# Patient Record
Sex: Male | Born: 1955 | Race: White | Hispanic: No | Smoking: Never smoker
Health system: Southern US, Community
[De-identification: ages and names within clinical notes are randomized; demographics above are authoritative.]

## PROBLEM LIST (undated history)

## (undated) DIAGNOSIS — R251 Tremor, unspecified: Secondary | ICD-10-CM

## (undated) DIAGNOSIS — G20 Parkinson's disease: Secondary | ICD-10-CM

## (undated) DIAGNOSIS — G20A1 Parkinson's disease without dyskinesia, without mention of fluctuations: Secondary | ICD-10-CM

## (undated) HISTORY — DX: Tremor, unspecified: R25.1

---

## 2016-11-26 ENCOUNTER — Ambulatory Visit: Payer: Self-pay | Admitting: Physician Assistant

## 2016-12-10 ENCOUNTER — Ambulatory Visit: Payer: Self-pay | Admitting: Physician Assistant

## 2016-12-17 ENCOUNTER — Ambulatory Visit: Payer: Self-pay | Admitting: Physician Assistant

## 2017-04-24 ENCOUNTER — Encounter: Payer: Self-pay | Admitting: Neurology

## 2017-04-30 ENCOUNTER — Encounter: Payer: Self-pay | Admitting: Neurology

## 2017-04-30 NOTE — Progress Notes (Addendum)
Thomas Huber was seen today in the movement disorders clinic for neurologic consultation at the request of Vyas, Dhruv B, MD.   The evaluation is for tremor.    This patient is accompanied in the office by his cousin who supplements the history.  Tremor started approximately 4 years ago and started initially in the L hand but over the last 6 months it involves the R hand.  Tremor is most noticeable when he is sitting at rest.   There is no family hx of tremor.    Affected by caffeine:  Doesn't know (5-6 diet pepsi per day on a "slow day") Affected by alcohol:  Yes.   (12 pack beer every 2 weeks - 1 can every 2 days) Affected by stress:  Yes.   Affected by fatigue:  No. Spills soup if on spoon:  No. Spills glass of liquid if full:  No.  Current/Previously tried tremor medications: propranolol 20 mg q day - started on this 3 months ago by PCP and it didn't help.  Taking one tablet at bedtime  Specific Symptoms:   Voice: softer Sleep: trouble staying asleep  Vivid Dreams:  No.  Acting out dreams:  No. Wet Pillows: Yes.   Postural symptoms:  Yes.    Falls?  No. Bradykinesia symptoms: shuffling gait, slow movements and difficulty getting out of a chair Loss of smell:  No. Loss of taste:  No. Urinary Incontinence:  No. Difficulty Swallowing:  No. Handwriting, micrographia: Yes.   Trouble with ADL's:  No. (struggles with tying shoes some) Depression:  No. Memory changes:  No. Hallucinations:  No.  visual distortions: Yes.  , rarely N/V:  No. Lightheaded:  No.  Syncope: No. Diplopia:  No. Dyskinesia:  No.  Exposure to GI meds/antidepressants:  no  Neuroimaging of the brain has not previously been performed.    PREVIOUS MEDICATIONS: none to date  ALLERGIES:  No Known Allergies  CURRENT MEDICATIONS:  Outpatient Encounter Medications as of 05/05/2017  Medication Sig  . propranolol (INDERAL) 40 MG tablet Take 40 mg by mouth at bedtime.  . carbidopa-levodopa (SINEMET IR)  25-100 MG tablet Take 1 tablet by mouth 3 (three) times daily.  . [DISCONTINUED] propranolol (INDERAL) 20 MG tablet Take 20 mg by mouth daily.   No facility-administered encounter medications on file as of 05/05/2017.     PAST MEDICAL HISTORY:   Past Medical History:  Diagnosis Date  . Tremor     PAST SURGICAL HISTORY:  History reviewed. No pertinent surgical history.  SOCIAL HISTORY:   Social History   Socioeconomic History  . Marital status: Unknown    Spouse name: Not on file  . Number of children: Not on file  . Years of education: Not on file  . Highest education level: Not on file  Social Needs  . Financial resource strain: Not on file  . Food insecurity - worry: Not on file  . Food insecurity - inability: Not on file  . Transportation needs - medical: Not on file  . Transportation needs - non-medical: Not on file  Occupational History  . Occupation: unemployed  Tobacco Use  . Smoking status: Never Smoker  . Smokeless tobacco: Never Used  Substance and Sexual Activity  . Alcohol use: Yes    Comment: 1 beer every other day  . Drug use: No  . Sexual activity: Not on file  Other Topics Concern  . Not on file  Social History Narrative  . Not on file  FAMILY HISTORY:   Family Status  Relation Name Status  . Mother  Alive  . Father  Deceased  . Son  Alive  . Daughter  Alive    ROS:  A complete 10 system review of systems was obtained and was unremarkable apart from what is mentioned above.  PHYSICAL EXAMINATION:    VITALS:   Vitals:   05/05/17 1338  BP: 134/90  Pulse: 62  SpO2: 94%  Weight: 219 lb (99.3 kg)  Height: 5' 7"  (1.702 m)    GEN:  The patient appears stated age and is in NAD. HEENT:  Normocephalic, atraumatic.  The mucous membranes are moist. The superficial temporal arteries are without ropiness or tenderness. CV:  RRR Lungs:  CTAB Neck/HEME:  There are no carotid bruits bilaterally.  Neurological examination:  Orientation: The  patient is alert and oriented x3. Fund of knowledge is appropriate.  Recent and remote memory are intact.  Attention and concentration are normal.    Able to name objects and repeat phrases. Cranial nerves: There is good facial symmetry. There is facial hypomimia.  Pupils are equal round and reactive to light bilaterally. Fundoscopic exam reveals clear margins bilaterally. Extraocular muscles are intact. The visual fields are full to confrontational testing. The speech is fluent and clear but he is hypophonic. Soft palate rises symmetrically and there is no tongue deviation. Hearing is intact to conversational tone. Sensation: Sensation is intact to light and pinprick throughout (facial, trunk, extremities). Vibration is intact at the bilateral big toe. There is no extinction with double simultaneous stimulation. There is no sensory dermatomal level identified. Motor: Strength is 5/5 in the bilateral upper and lower extremities.   Shoulder shrug is equal and symmetric.  There is no pronator drift. Deep tendon reflexes: Deep tendon reflexes are 1/4 at the bilateral biceps, triceps, brachioradialis, patella and achilles. Plantar responses are downgoing bilaterally.  Movement examination: Tone: There is moderate increased tone in the bilateral upper extremities.  The tone in the lower extremities is moderately increased.  Abnormal movements: There is independent R and LUE resting tremor Coordination:  There is decremation with RAM's, with any form of RAMS, including alternating supination and pronation of the forearm, hand opening and closing, finger taps, heel taps and toe taps bilaterally Gait and Station: The patient has minimal difficulty arising out of a deep-seated chair without the use of the hands. The patient's stride length is decreased.  The patient has a negative pull test.      Addendum labs:   Lab work is received from his primary care physician which was dated January 21, 2017.  Sodium was  143, potassium 4.1, chloride 102, CO2 25, BUN 6, creatinine 1.05, glucose 104, AST 24, ALT 24, alkaline phosphatase 68.  White blood cells were 7.5, hemoglobin 16.1, hematocrit 47.3 and platelets 218.   ASSESSMENT/PLAN:  1.  Idiopathic Parkinson's disease.  The patient has tremor, bradykinesia, rigidity and mild postural instability.  Dx: 05/05/17 but suspect that has had disease for 4 years (onset of unilateral tremor)  -We discussed the diagnosis as well as pathophysiology of the disease.  We discussed treatment options as well as prognostic indicators.  Patient education was provided.  -We discussed that it used to be thought that levodopa would increase risk of melanoma but now it is believed that Parkinsons itself likely increases risk of melanoma. he is to get regular skin checks.  -Greater than 50% of the 60 minute visit was spent in counseling answering questions and  talking about what to expect now as well as in the future.  We talked about medication options as well as potential future surgical options.  We talked about safety in the home.  -We decided to add carbidopa/levodopa 25/100.  1/2 tab tid x 1 wk, then 1/2 in am & noon & 1 at night for a week, then 1/2 in am &1 at noon &night for a week, then 1 po tid.  Risks, benefits, side effects and alternative therapies were discussed.  The opportunity to ask questions was given and they were answered to the best of my ability.  The patient expressed understanding and willingness to follow the outlined treatment protocols.  -Home PT.  Pt doesn't drive and relies on elderly cousin to drive.  Does use some public transportation  -We discussed community resources in the area.  Printed off open doors application at [facility] to try and get him exercise at reduced cost  -will try to get copy of patient labs  -d/c propranolol  -met with PD social worker today  2.  Follow up is anticipated in the next few months, sooner should new neurologic issues  arise.  Cc:  Arsenio Katz, NP

## 2017-05-05 ENCOUNTER — Ambulatory Visit: Payer: Medicaid Other | Admitting: Neurology

## 2017-05-05 ENCOUNTER — Encounter: Payer: Self-pay | Admitting: Psychology

## 2017-05-05 ENCOUNTER — Encounter: Payer: Self-pay | Admitting: Neurology

## 2017-05-05 VITALS — BP 134/90 | HR 62 | Ht 67.0 in | Wt 219.0 lb

## 2017-05-05 DIAGNOSIS — G20 Parkinson's disease: Secondary | ICD-10-CM

## 2017-05-05 MED ORDER — CARBIDOPA-LEVODOPA 25-100 MG PO TABS: 1.0000 | ORAL_TABLET | Freq: Three times a day (TID) | ORAL | 1 refills | Status: DC

## 2017-05-05 NOTE — Patient Instructions (Signed)
1. Start Carbidopa Levodopa as follows:  Take 1/2 tablet three times daily, at least 30 minutes before meals, for one week  Then take 1/2 tablet in the morning, 1/2 tablet in the afternoon, 1 tablet in the evening, at least 30 minutes before meals, for one week  Then take 1/2 tablet in the morning, 1 tablet in the afternoon, 1 tablet in the evening, at least 30 minutes before meals, for one week  Then take 1 tablet three times daily, at least 30 minutes before meals  2. We will order home health physical therapy. They will contact you directly to schedule a time to come out.

## 2017-05-05 NOTE — Addendum Note (Signed)
Addended by: Kerin SalenAT, Koah Chisenhall S on: 05/05/2017 01:42 PM   Modules accepted: Level of Service

## 2017-05-05 NOTE — Progress Notes (Signed)
I met with the patient and his family member while they were in the clinic today.  We reviewed some newly diagnosed information/resources.  However, I focused mainly on resources that the patient would be able to functionally use at this time due to barriers to transportation.  The closest support group is in [place].  There are no exercise classes specifically for Parkinson's in [place], so the next best option would be for the patient to go to the [facility]  through their open doors financial assistance program.  The benefit of attending the [facility] will also allow him to be more engaged in the community.  I provided him with application for this and encouraged him and his family member to complete it and stop by the [facility] this week.  The patient has access to [employer] transportation from his apartment complex and will have the capacity to get to the [facility] easily.    The plan is for the following: The patient will get his prescriptions filled and take as prescribed. The patient will start his in him physical therapy. The patient will complete the [facility] application and start attending the [employer] program prior to his next appointment with Dr. Carles Collet. The patient will call me with any problems or concerns that he has in terms of needing resources or coping with this new diagnosis.

## 2017-05-09 ENCOUNTER — Telehealth: Payer: Self-pay | Admitting: Neurology

## 2017-05-09 NOTE — Telephone Encounter (Signed)
Spoke with Thomas Huber and gave verbal order for PT.

## 2017-05-09 NOTE — Telephone Encounter (Signed)
Thomas Huber called needing Verbal orders. Patient had completed PT today. Please call. Thanks

## 2017-08-11 NOTE — Progress Notes (Deleted)
Thomas Huber was seen today in the movement disorders clinic for neurologic consultation at the request of Arsenio Katz, NP.   The evaluation is for tremor.    This patient is accompanied in the office by his cousin who supplements the history.  Tremor started approximately 4 years ago and started initially in the L hand but over the last 6 months it involves the R hand.  Tremor is most noticeable when he is sitting at rest.   There is no family hx of tremor.    08/12/17 update: Patient is seen today in follow-up.  Patient was diagnosed with Parkinson's disease last visit and started on carbidopa/levodopa 25/100 and worked to 1 tablet 3 times per day.  He did physical therapy in the home.  Pt denies falls.  Pt denies lightheadedness, near syncope.  No hallucinations.  Mood has been good.  PREVIOUS MEDICATIONS: none to date  ALLERGIES:  No Known Allergies  CURRENT MEDICATIONS:  Outpatient Encounter Medications as of 08/12/2017  Medication Sig  . carbidopa-levodopa (SINEMET IR) 25-100 MG tablet Take 1 tablet by mouth 3 (three) times daily.  . propranolol (INDERAL) 40 MG tablet Take 40 mg by mouth at bedtime.   No facility-administered encounter medications on file as of 08/12/2017.     PAST MEDICAL HISTORY:   Past Medical History:  Diagnosis Date  . Tremor     PAST SURGICAL HISTORY:  No past surgical history on file.  SOCIAL HISTORY:   Social History   Socioeconomic History  . Marital status: Unknown    Spouse name: Not on file  . Number of children: Not on file  . Years of education: Not on file  . Highest education level: Not on file  Occupational History  . Occupation: unemployed  Social Needs  . Financial resource strain: Not on file  . Food insecurity:    Worry: Not on file    Inability: Not on file  . Transportation needs:    Medical: Not on file    Non-medical: Not on file  Tobacco Use  . Smoking status: Never Smoker  . Smokeless tobacco: Never Used  Substance  and Sexual Activity  . Alcohol use: Yes    Comment: 1 beer every other day  . Drug use: No  . Sexual activity: Not on file  Lifestyle  . Physical activity:    Days per week: Not on file    Minutes per session: Not on file  . Stress: Not on file  Relationships  . Social connections:    Talks on phone: Not on file    Gets together: Not on file    Attends religious service: Not on file    Active member of club or organization: Not on file    Attends meetings of clubs or organizations: Not on file    Relationship status: Not on file  . Intimate partner violence:    Fear of current or ex partner: Not on file    Emotionally abused: Not on file    Physically abused: Not on file    Forced sexual activity: Not on file  Other Topics Concern  . Not on file  Social History Narrative  . Not on file    FAMILY HISTORY:   Family Status  Relation Name Status  . Mother  Alive  . Father  Deceased  . Son  Alive  . Daughter  Alive    ROS:  A complete 10 system review of systems was obtained  and was unremarkable apart from what is mentioned above.  PHYSICAL EXAMINATION:    VITALS:   There were no vitals filed for this visit.  GEN:  The patient appears stated age and is in NAD. HEENT:  Normocephalic, atraumatic.  The mucous membranes are moist. The superficial temporal arteries are without ropiness or tenderness. CV:  RRR Lungs:  CTAB Neck/HEME:  There are no carotid bruits bilaterally.  Neurological examination:  Orientation: The patient is alert and oriented x3. Fund of knowledge is appropriate.  Recent and remote memory are intact.  Attention and concentration are normal.    Able to name objects and repeat phrases. Cranial nerves: There is good facial symmetry. There is facial hypomimia.  Pupils are equal round and reactive to light bilaterally. Fundoscopic exam reveals clear margins bilaterally. Extraocular muscles are intact. The visual fields are full to confrontational testing.  The speech is fluent and clear but he is hypophonic. Soft palate rises symmetrically and there is no tongue deviation. Hearing is intact to conversational tone. Sensation: Sensation is intact to light and pinprick throughout (facial, trunk, extremities). Vibration is intact at the bilateral big toe. There is no extinction with double simultaneous stimulation. There is no sensory dermatomal level identified. Motor: Strength is 5/5 in the bilateral upper and lower extremities.   Shoulder shrug is equal and symmetric.  There is no pronator drift. Deep tendon reflexes: Deep tendon reflexes are 1/4 at the bilateral biceps, triceps, brachioradialis, patella and achilles. Plantar responses are downgoing bilaterally.  Movement examination: Tone: There is moderate increased tone in the bilateral upper extremities.  The tone in the lower extremities is moderately increased.  Abnormal movements: There is independent R and LUE resting tremor Coordination:  There is decremation with RAM's, with any form of RAMS, including alternating supination and pronation of the forearm, hand opening and closing, finger taps, heel taps and toe taps bilaterally Gait and Station: The patient has minimal difficulty arising out of a deep-seated chair without the use of the hands. The patient's stride length is decreased.  The patient has a negative pull test.      labs:   Lab work is received from his primary care physician which was dated January 21, 2017.  Sodium was 143, potassium 4.1, chloride 102, CO2 25, BUN 6, creatinine 1.05, glucose 104, AST 24, ALT 24, alkaline phosphatase 68.  White blood cells were 7.5, hemoglobin 16.1, hematocrit 47.3 and platelets 218.   ASSESSMENT/PLAN:  1.  Idiopathic Parkinson's disease.  The patient has tremor, bradykinesia, rigidity and mild postural instability.  Dx: 05/05/17 but suspect that has had disease for 4 years (onset of unilateral tremor)  -We discussed the diagnosis as well as  pathophysiology of the disease.  We discussed treatment options as well as prognostic indicators.  Patient education was provided.  -We discussed that it used to be thought that levodopa would increase risk of melanoma but now it is believed that Parkinsons itself likely increases risk of melanoma. he is to get regular skin checks.  -Home PT.  Pt doesn't drive and relies on elderly cousin to drive.  Does use some public transportation  -We discussed community resources in the area.  Printed off open doors application at [facility] to try and get him exercise at reduced cost  -will try to get copy of patient labs  -d/c propranolol  -met with PD social worker today  2.  Follow up is anticipated in the next few months, sooner should new neurologic issues  arise.  Cc:  Arsenio Katz, NP

## 2017-08-12 ENCOUNTER — Ambulatory Visit: Payer: Medicaid Other | Admitting: Neurology

## 2017-09-05 NOTE — Progress Notes (Signed)
Thomas Huber was seen today in the movement disorders clinic for neurologic consultation at the request of Salley Slaughter, NP.   The evaluation is for tremor.    This patient is accompanied in the office by his cousin who supplements the history.  Tremor started approximately 4 years ago and started initially in the L hand but over the last 6 months it involves the R hand.  Tremor is most noticeable when he is sitting at rest.   There is no family hx of tremor.    09/08/17 update: Patient is seen today in follow-up.  Patient was diagnosed with Parkinson's disease last visit and started on carbidopa/levodopa 25/100 and worked to 1 tablet 3 times per day (7am/1pm/7pm). This patient is accompanied in the office by his cousin who supplements the history.He reports that it helped.   He did physical therapy in the home.  Pt denies falls.  He is able to walk around his apartment building several laps.  Pt denies lightheadedness, near syncope.  No hallucinations.  Mood has been good.  PREVIOUS MEDICATIONS: none to date  ALLERGIES:  No Known Allergies  CURRENT MEDICATIONS:  Outpatient Encounter Medications as of 09/08/2017  Medication Sig  . carbidopa-levodopa (SINEMET IR) 25-100 MG tablet Take 1 tablet by mouth 3 (three) times daily.  . pramipexole (MIRAPEX) 0.125 MG tablet 1 tablet three times per day for a week, then 2 tablets three times per day for a week and then fill the 0.5 mg tablet take as directed  . pramipexole (MIRAPEX) 0.5 MG tablet Take 1 tablet (0.5 mg total) by mouth 3 (three) times daily.  . [DISCONTINUED] propranolol (INDERAL) 40 MG tablet Take 40 mg by mouth at bedtime.   No facility-administered encounter medications on file as of 09/08/2017.     PAST MEDICAL HISTORY:   Past Medical History:  Diagnosis Date  . Tremor     PAST SURGICAL HISTORY:  History reviewed. No pertinent surgical history.  SOCIAL HISTORY:   Social History   Socioeconomic History  . Marital status:  Unknown    Spouse name: Not on file  . Number of children: Not on file  . Years of education: Not on file  . Highest education level: Not on file  Occupational History  . Occupation: unemployed  Social Needs  . Financial resource strain: Not on file  . Food insecurity:    Worry: Not on file    Inability: Not on file  . Transportation needs:    Medical: Not on file    Non-medical: Not on file  Tobacco Use  . Smoking status: Never Smoker  . Smokeless tobacco: Never Used  Substance and Sexual Activity  . Alcohol use: Yes    Comment: 1 beer every other day  . Drug use: No  . Sexual activity: Not on file  Lifestyle  . Physical activity:    Days per week: Not on file    Minutes per session: Not on file  . Stress: Not on file  Relationships  . Social connections:    Talks on phone: Not on file    Gets together: Not on file    Attends religious service: Not on file    Active member of club or organization: Not on file    Attends meetings of clubs or organizations: Not on file    Relationship status: Not on file  . Intimate partner violence:    Fear of current or ex partner: Not on file  Emotionally abused: Not on file    Physically abused: Not on file    Forced sexual activity: Not on file  Other Topics Concern  . Not on file  Social History Narrative  . Not on file    FAMILY HISTORY:   Family Status  Relation Name Status  . Mother  Alive  . Father  Deceased  . Son  Alive  . Daughter  Alive    ROS:  A complete 10 system review of systems was obtained and was unremarkable apart from what is mentioned above.  PHYSICAL EXAMINATION:    VITALS:   Vitals:   09/08/17 1359  BP: 112/68  Pulse: 77  SpO2: 93%  Weight: 216 lb (98 kg)  Height: 5\' 7"  (1.702 m)    GEN:  The patient appears stated age and is in NAD. HEENT:  Normocephalic, atraumatic.  The mucous membranes are moist. The superficial temporal arteries are without ropiness or tenderness. CV:   RRR Lungs:  CTAB Neck/HEME:  There are no carotid bruits bilaterally.  Neurological examination:  Orientation: The patient is alert and oriented x3. Cranial nerves: There is good facial symmetry. The speech is fluent and clear. Soft palate rises symmetrically and there is no tongue deviation. Hearing is intact to conversational tone. Sensation: Sensation is intact to light touch throughout Motor: Strength is 5/5 in the bilateral upper and lower extremities.   Shoulder shrug is equal and symmetric.  There is no pronator drift.  Movement examination: Tone: There is moderate increased tone in the bilateral upper extremities.  The tone in the lower extremities is moderately increased.  Abnormal movements: There is independent R and LUE resting tremor Coordination:  There is decremation with RAM's, with any form of RAMS, including alternating supination and pronation of the forearm, hand opening and closing, finger taps, heel taps and toe taps bilaterally, R more than left Gait and Station: The patient has minimal difficulty arising out of a deep-seated chair without the use of the hands. The patient's stride length is decreased.  The patient has a negative pull test.      labs:   Lab work is received from his primary care physician which was dated January 21, 2017.  Sodium was 143, potassium 4.1, chloride 102, CO2 25, BUN 6, creatinine 1.05, glucose 104, AST 24, ALT 24, alkaline phosphatase 68.  White blood cells were 7.5, hemoglobin 16.1, hematocrit 47.3 and platelets 218.   ASSESSMENT/PLAN:  1.  Idiopathic Parkinson's disease.  The patient has tremor, bradykinesia, rigidity and mild postural instability.  Dx: 05/05/17 but suspect that has had disease for 4 years (onset of unilateral tremor)  -We discussed the diagnosis as well as pathophysiology of the disease.  We discussed treatment options as well as prognostic indicators.  Patient education was provided.  -We discussed that it used to be  thought that levodopa would increase risk of melanoma but now it is believed that Parkinsons itself likely increases risk of melanoma. he is to get regular skin checks.  -Home PT.  Pt doesn't drive and relies on elderly cousin to drive.  Does use some public transportation  -Discussed importance of increasing exercise.  -Patient is on carbidopa/levodopa 25/100, 1 tablet 3 times per day.  States that he takes it faithfully.  States that he took it about 2 hours prior to arriving today and examination was very similar to last visit.  May need to increase the dose, but decided to go ahead and try to add pramipexole  first.  -add pramipexole and work 0.5 mg tid.  R/b/se discussed extensively, including but not limited to compulsive behaviors and sleep attacks.    -Invited to Parkinson's symposium.  2.  Follow-up in the next 4 months, sooner should new neurologic issues arise.  Much greater than 50% of this visit was spent in counseling and coordinating care.  Total face to face time:  25 min  Cc:  Salley SlaughterBoone, Angela, NP

## 2017-09-08 ENCOUNTER — Ambulatory Visit: Payer: Medicaid Other | Admitting: Neurology

## 2017-09-08 ENCOUNTER — Encounter: Payer: Self-pay | Admitting: Neurology

## 2017-09-08 VITALS — BP 112/68 | HR 77 | Ht 67.0 in | Wt 216.0 lb

## 2017-09-08 DIAGNOSIS — G20 Parkinson's disease: Secondary | ICD-10-CM

## 2017-09-08 MED ORDER — PRAMIPEXOLE DIHYDROCHLORIDE 0.5 MG PO TABS: 0.5000 mg | ORAL_TABLET | Freq: Three times a day (TID) | ORAL | 1 refills | Status: DC

## 2017-09-08 MED ORDER — PRAMIPEXOLE DIHYDROCHLORIDE 0.125 MG PO TABS: ORAL_TABLET | ORAL | 0 refills | Status: DC

## 2017-09-08 NOTE — Patient Instructions (Signed)
1. Start mirapex (pramipexole) as follows:  0.125 mg - 1 tablet three times per day for a week, then 2 tablets three times per day for a week and then fill the 0.5 mg tablet and take that, 1 pill three times per day  2. Registration is OPEN!    Third Annual Parkinson's Education Symposium   To register: [email or web address]      Search:  Parkinson's Symposium  Register EACH person attending individually Questions: Contact Jessica Thomas, LCSW  [phone] or [email or web address]    

## 2017-11-06 ENCOUNTER — Other Ambulatory Visit: Payer: Self-pay | Admitting: Neurology

## 2017-11-06 MED ORDER — CARBIDOPA-LEVODOPA 25-100 MG PO TABS: 1.0000 | ORAL_TABLET | Freq: Three times a day (TID) | ORAL | 1 refills | Status: DC

## 2018-01-27 ENCOUNTER — Ambulatory Visit: Payer: Medicaid Other | Admitting: Neurology

## 2018-02-02 NOTE — Progress Notes (Deleted)
Thomas Huber was seen today in the movement disorders clinic for neurologic consultation at the request of Salley Slaughter, NP.   The evaluation is for tremor.    This patient is accompanied in the office by his cousin who supplements the history.  Tremor started approximately 4 years ago and started initially in the L hand but over the last 6 months it involves the R hand.  Tremor is most noticeable when he is sitting at rest.   There is no family hx of tremor.    09/08/17 update: Patient is seen today in follow-up.  Patient was diagnosed with Parkinson's disease last visit and started on carbidopa/levodopa 25/100 and worked to 1 tablet 3 times per day (7am/1pm/7pm). This patient is accompanied in the office by his cousin who supplements the history.He reports that it helped.   He did physical therapy in the home.  Pt denies falls.  He is able to walk around his apartment building several laps.  Pt denies lightheadedness, near syncope.  No hallucinations.  Mood has been good.  02/03/18 update: Patient is seen today in follow-up for Parkinson's disease.  Patient is on carbidopa/levodopa 25/100, 1 tablet 3 times per day.  Last visit, we added pramipexole and worked to 0.5 mg 3 times per day.  There have been no compulsive behaviors.  No sleep attacks.  No hallucinations.  No falls.  No lightheadedness or near syncope.  PREVIOUS MEDICATIONS: none to date  ALLERGIES:  No Known Allergies  CURRENT MEDICATIONS:  Outpatient Encounter Medications as of 02/03/2018  Medication Sig  . carbidopa-levodopa (SINEMET IR) 25-100 MG tablet Take 1 tablet by mouth 3 (three) times daily.  . pramipexole (MIRAPEX) 0.125 MG tablet 1 tablet three times per day for a week, then 2 tablets three times per day for a week and then fill the 0.5 mg tablet take as directed  . pramipexole (MIRAPEX) 0.5 MG tablet Take 1 tablet (0.5 mg total) by mouth 3 (three) times daily.   No facility-administered encounter medications on file  as of 02/03/2018.     PAST MEDICAL HISTORY:   Past Medical History:  Diagnosis Date  . Tremor     PAST SURGICAL HISTORY:  No past surgical history on file.  SOCIAL HISTORY:   Social History   Socioeconomic History  . Marital status: Unknown    Spouse name: Not on file  . Number of children: Not on file  . Years of education: Not on file  . Highest education level: Not on file  Occupational History  . Occupation: unemployed  Social Needs  . Financial resource strain: Not on file  . Food insecurity:    Worry: Not on file    Inability: Not on file  . Transportation needs:    Medical: Not on file    Non-medical: Not on file  Tobacco Use  . Smoking status: Never Smoker  . Smokeless tobacco: Never Used  Substance and Sexual Activity  . Alcohol use: Yes    Comment: 1 beer every other day  . Drug use: No  . Sexual activity: Not on file  Lifestyle  . Physical activity:    Days per week: Not on file    Minutes per session: Not on file  . Stress: Not on file  Relationships  . Social connections:    Talks on phone: Not on file    Gets together: Not on file    Attends religious service: Not on file    Active  member of club or organization: Not on file    Attends meetings of clubs or organizations: Not on file    Relationship status: Not on file  . Intimate partner violence:    Fear of current or ex partner: Not on file    Emotionally abused: Not on file    Physically abused: Not on file    Forced sexual activity: Not on file  Other Topics Concern  . Not on file  Social History Narrative  . Not on file    FAMILY HISTORY:   Family Status  Relation Name Status  . Mother  Alive  . Father  Deceased  . Son  Alive  . Daughter  Alive    ROS:  A complete 10 system review of systems was obtained and was unremarkable apart from what is mentioned above.  PHYSICAL EXAMINATION:    VITALS:   There were no vitals filed for this visit.  GEN:  The patient appears stated  age and is in NAD. HEENT:  Normocephalic, atraumatic.  The mucous membranes are moist. The superficial temporal arteries are without ropiness or tenderness. CV:  RRR Lungs:  CTAB Neck/HEME:  There are no carotid bruits bilaterally.  Neurological examination:  Orientation: The patient is alert and oriented x3. Cranial nerves: There is good facial symmetry. The speech is fluent and clear. Soft palate rises symmetrically and there is no tongue deviation. Hearing is intact to conversational tone. Sensation: Sensation is intact to light touch throughout Motor: Strength is 5/5 in the bilateral upper and lower extremities.   Shoulder shrug is equal and symmetric.  There is no pronator drift.  Movement examination: Tone: There is moderate increased tone in the bilateral upper extremities.  The tone in the lower extremities is moderately increased.  Abnormal movements: There is independent R and LUE resting tremor Coordination:  There is decremation with RAM's, with any form of RAMS, including alternating supination and pronation of the forearm, hand opening and closing, finger taps, heel taps and toe taps bilaterally, R more than left Gait and Station: The patient has minimal difficulty arising out of a deep-seated chair without the use of the hands. The patient's stride length is decreased.  The patient has a negative pull test.      labs:   Lab work is received from his primary care physician which was dated January 21, 2017.  Sodium was 143, potassium 4.1, chloride 102, CO2 25, BUN 6, creatinine 1.05, glucose 104, AST 24, ALT 24, alkaline phosphatase 68.  White blood cells were 7.5, hemoglobin 16.1, hematocrit 47.3 and platelets 218.   ASSESSMENT/PLAN:  1.  Idiopathic Parkinson's disease.  The patient has tremor, bradykinesia, rigidity and mild postural instability.  Dx: 05/05/17 but suspect that has had disease for 4 years (onset of unilateral tremor)  -We discussed the diagnosis as well as  pathophysiology of the disease.  We discussed treatment options as well as prognostic indicators.  Patient education was provided.  -We discussed that it used to be thought that levodopa would increase risk of melanoma but now it is believed that Parkinsons itself likely increases risk of melanoma. he is to get regular skin checks.  -Home PT.  Pt doesn't drive and relies on elderly cousin to drive.  Does use some public transportation  -Discussed importance of increasing exercise.  -Patient is on carbidopa/levodopa 25/100, 1 tablet 3 times per day.  States that he takes it faithfully.  States that he took it about 2 hours prior  to arriving today and examination was very similar to last visit.  May need to increase the dose, but decided to go ahead and try to add pramipexole first.  -***pramipexole   Cc:  Salley Slaughter, NP

## 2018-02-03 ENCOUNTER — Ambulatory Visit: Payer: Medicaid Other | Admitting: Neurology

## 2018-02-10 NOTE — Progress Notes (Signed)
Thomas Huber was seen today in the movement disorders clinic for neurologic consultation at the request of Salley Slaughter, NP.   The evaluation is for tremor.    This patient is accompanied in the office by his cousin who supplements the history.  Tremor started approximately 4 years ago and started initially in the L hand but over the last 6 months it involves the R hand.  Tremor is most noticeable when he is sitting at rest.   There is no family hx of tremor.    09/08/17 update: Patient is seen today in follow-up.  Patient was diagnosed with Parkinson's disease last visit and started on carbidopa/levodopa 25/100 and worked to 1 tablet 3 times per day (7am/1pm/7pm). This patient is accompanied in the office by his cousin who supplements the history.He reports that it helped.   He did physical therapy in the home.  Pt denies falls.  He is able to walk around his apartment building several laps.  Pt denies lightheadedness, near syncope.  No hallucinations.  Mood has been good.  02/16/18 update: Patient is seen today in follow-up for Parkinson's disease.  Pt with his cousin, who supplements the hx.  Patient is on carbidopa/levodopa 25/100, 1 tablet 3 times per day.  Last visit, we added pramipexole and worked to 0.5 mg 3 times per day.  He is on the medication but isn't sure it helps.  Describes start hesitation.  There have been no compulsive behaviors.  No sleep attacks.  No hallucinations in day but a few times at night, he would awaken and think that someone was there but was able to blink it away.  No falls.  No lightheadedness but some dizziness but he attributes that to when he misses medication.  Not doing much CV exercise.    PREVIOUS MEDICATIONS: none to date  ALLERGIES:  No Known Allergies  CURRENT MEDICATIONS:  Outpatient Encounter Medications as of 02/16/2018  Medication Sig  . carbidopa-levodopa (SINEMET IR) 25-100 MG tablet Take 1 tablet by mouth 3 (three) times daily.  . pramipexole  (MIRAPEX) 0.5 MG tablet Take 1 tablet (0.5 mg total) by mouth 3 (three) times daily.  . [DISCONTINUED] pramipexole (MIRAPEX) 0.125 MG tablet 1 tablet three times per day for a week, then 2 tablets three times per day for a week and then fill the 0.5 mg tablet take as directed   No facility-administered encounter medications on file as of 02/16/2018.     PAST MEDICAL HISTORY:   Past Medical History:  Diagnosis Date  . Tremor     PAST SURGICAL HISTORY:  History reviewed. No pertinent surgical history.  SOCIAL HISTORY:   Social History   Socioeconomic History  . Marital status: Unknown    Spouse name: Not on file  . Number of children: 2  . Years of education: Not on file  . Highest education level: Not on file  Occupational History  . Occupation: unemployed  Social Needs  . Financial resource strain: Not on file  . Food insecurity:    Worry: Not on file    Inability: Not on file  . Transportation needs:    Medical: Not on file    Non-medical: Not on file  Tobacco Use  . Smoking status: Never Smoker  . Smokeless tobacco: Never Used  Substance and Sexual Activity  . Alcohol use: Yes    Comment: 1 beer every other day  . Drug use: No  . Sexual activity: Not on file  Lifestyle  .  Physical activity:    Days per week: Not on file    Minutes per session: Not on file  . Stress: Not on file  Relationships  . Social connections:    Talks on phone: Not on file    Gets together: Not on file    Attends religious service: Not on file    Active member of club or organization: Not on file    Attends meetings of clubs or organizations: Not on file    Relationship status: Not on file  . Intimate partner violence:    Fear of current or ex partner: Not on file    Emotionally abused: Not on file    Physically abused: Not on file    Forced sexual activity: Not on file  Other Topics Concern  . Not on file  Social History Narrative  . Not on file    FAMILY HISTORY:   Family  Status  Relation Name Status  . Mother  Alive  . Father  Deceased  . Son  Alive  . Daughter  Alive    ROS:  Review of Systems  Constitutional: Negative.   HENT: Negative.   Eyes: Negative.   Respiratory: Negative.   Cardiovascular: Negative.   Musculoskeletal: Negative.   Skin: Negative.      PHYSICAL EXAMINATION:    VITALS:   Vitals:   02/16/18 0757  BP: 130/74  Pulse: 80  SpO2: 97%  Weight: 220 lb 8 oz (100 kg)  Height: 5\' 7"  (1.702 m)    GEN:  The patient appears stated age and is in NAD. HEENT:  Normocephalic, atraumatic.  The mucous membranes are moist. The superficial temporal arteries are without ropiness or tenderness. CV:  RRR Lungs:  CTAB Neck/HEME:  There are no carotid bruits bilaterally.  Neurological examination:  Orientation: The patient is alert and oriented x3. Cranial nerves: There is good facial symmetry. The speech is fluent and clear. Soft palate rises symmetrically and there is no tongue deviation. Hearing is intact to conversational tone. Sensation: Sensation is intact to light touch throughout Motor: Strength is 5/5 in the bilateral upper and lower extremities.   Shoulder shrug is equal and symmetric.  There is no pronator drift.  Movement examination: Tone: There is moderate increased tone in the bilateral upper extremities.  The tone in the lower extremities is moderately increased.  Abnormal movements: There is independent R and LUE resting tremor Coordination:  There is decremation with RAM's, with any form of RAMS, including alternating supination and pronation of the forearm, hand opening and closing, finger taps, heel taps and toe taps bilaterally, R more than left Gait and Station: The patient has minimal difficulty arising out of a deep-seated chair without the use of the hands. The patient's stride length is decreased.  The patient has a negative pull test.      labs:   Lab work is received from his primary care physician which was  dated January 21, 2017.  We called and this was the most recent lab work that he has had (and we already had this)   ASSESSMENT/PLAN:  1.  Idiopathic Parkinson's disease.  The patient has tremor, bradykinesia, rigidity and mild postural instability.  Dx: 05/05/17 but suspect that has had disease for 4 years (onset of unilateral tremor)  -We discussed the diagnosis as well as pathophysiology of the disease.  We discussed treatment options as well as prognostic indicators.  Patient education was provided.  -We discussed that it used to  be thought that levodopa would increase risk of melanoma but now it is believed that Parkinsons itself likely increases risk of melanoma. he is to get regular skin checks.  -Discussed importance of increasing exercise and discussed several times previously as well.  -Patient is on carbidopa/levodopa 25/100, 1 tablet 3 times per day.  States that he takes it faithfully.  States that he took it about 2 hours prior to arriving today and examination was very similar to last visit.  May need to increase the dose, but decided to go ahead and try to add pramipexole first.  -continue pramipexole, 0.5 mg tid.  Difficult to tell how much medication works as he didn't take it this AM.  I may end up doing a levodopa challenge test in the future.  Having some visual distortions at night and will monitor this  -we will do chem, tsh today.    2.  Gout  -asks me to fill that medication and told him would need to f/u with PCP  3.  Follow up is anticipated in the next few months, sooner should new neurologic issues arise.   Cc:  Salley SlaughterBoone, Angela, NP

## 2018-02-16 ENCOUNTER — Ambulatory Visit: Payer: Medicaid Other | Admitting: Neurology

## 2018-02-16 ENCOUNTER — Other Ambulatory Visit (INDEPENDENT_AMBULATORY_CARE_PROVIDER_SITE_OTHER): Payer: Medicaid Other

## 2018-02-16 ENCOUNTER — Encounter: Payer: Self-pay | Admitting: Neurology

## 2018-02-16 VITALS — BP 130/74 | HR 80 | Ht 67.0 in | Wt 220.5 lb

## 2018-02-16 DIAGNOSIS — G20 Parkinson's disease: Secondary | ICD-10-CM

## 2018-02-16 DIAGNOSIS — Z5181 Encounter for therapeutic drug level monitoring: Secondary | ICD-10-CM

## 2018-02-16 DIAGNOSIS — R251 Tremor, unspecified: Secondary | ICD-10-CM

## 2018-02-16 NOTE — Patient Instructions (Addendum)
Your provider has requested that you have labwork completed today. Please go to [facility] ([id number]) on the second floor of this building before leaving the office today. You do not need to check in. If you are not called within 15 minutes please check with the front desk.    Drink 60 ounces of water per day!   Exercise.

## 2018-02-17 ENCOUNTER — Telehealth: Payer: Self-pay | Admitting: Neurology

## 2018-02-17 LAB — COMPREHENSIVE METABOLIC PANEL
AG Ratio: 1.6 (calc) (ref 1.0–2.5)
ALKALINE PHOSPHATASE (APISO): 71 U/L (ref 40–115)
ALT: 14 U/L (ref 9–46)
AST: 18 U/L (ref 10–35)
Albumin: 4.2 g/dL (ref 3.6–5.1)
BILIRUBIN TOTAL: 0.6 mg/dL (ref 0.2–1.2)
BUN: 11 mg/dL (ref 7–25)
CHLORIDE: 104 mmol/L (ref 98–110)
CO2: 27 mmol/L (ref 20–32)
CREATININE: 0.95 mg/dL (ref 0.70–1.25)
Calcium: 9.3 mg/dL (ref 8.6–10.3)
GLOBULIN: 2.7 g/dL (ref 1.9–3.7)
Glucose, Bld: 82 mg/dL (ref 65–99)
POTASSIUM: 4 mmol/L (ref 3.5–5.3)
SODIUM: 142 mmol/L (ref 135–146)
TOTAL PROTEIN: 6.9 g/dL (ref 6.1–8.1)

## 2018-02-17 LAB — TSH: TSH: 3.2 m[IU]/L (ref 0.40–4.50)

## 2018-02-17 NOTE — Telephone Encounter (Signed)
Patient's aunt made aware of results (on DPR).

## 2018-02-17 NOTE — Telephone Encounter (Signed)
-----   Message from Octaviano Battyebecca S Tat, DO sent at 02/17/2018  7:25 AM EST ----- I have reviewed all lab results which are normal or stable. Please inform the patient.

## 2018-03-19 ENCOUNTER — Other Ambulatory Visit: Payer: Self-pay | Admitting: Neurology

## 2018-05-06 ENCOUNTER — Other Ambulatory Visit: Payer: Self-pay | Admitting: Neurology

## 2018-06-04 ENCOUNTER — Other Ambulatory Visit: Payer: Self-pay | Admitting: Neurology

## 2018-06-26 ENCOUNTER — Telehealth: Payer: Self-pay | Admitting: Neurology

## 2018-06-26 NOTE — Telephone Encounter (Signed)
Thomas Huber sch appt with Tat for a Evisit on 06-29-18 she is not going to be able to do the visit on video or smart phone that day she wants to know if it can just be done over the telephone with patient with out video  Please advise

## 2018-06-26 NOTE — Progress Notes (Signed)
Virtual Visit via Video Note The purpose of this virtual visit is to provide medical care while limiting exposure to the novel coronavirus.    Consent was obtained for video visit:  Yes.   Answered questions that patient had about telehealth interaction:  Yes.   I discussed the limitations, risks, security and privacy concerns of performing an evaluation and management service by video. I also discussed with the patient that there may be a patient responsible charge related to this service. The patient expressed understanding and agreed to proceed.  Pt location: Home Physician Location: office Name of referring provider:  Salley Slaughter, NP I connected with .Thomas Huber at patients initiation/request on 06/29/2018 at  2:00 PM EDT by video and verified that I am speaking with the correct person using two identifiers.  Pt MRN:  [id number] Pt DOB:  [birth year]  Video participants:  Thomas Huber (cousin); Thomas Huber   History of Present Illness:  Patient seen in follow-up for Parkinson's disease.  Patient is on carbidopa/levodopa 25/100, 1 tablet 3 times per day and pramipexole 0.5 mg 3 times per day.  He is taking the medications at 9 AM/5 PM/9 PM.  He does not go to bed until after midnight.  He does think that the medications help, but not as much as they did in the beginning.  No falls since his last visit but he has had freezing.  He notes some tremor.  No lightheadedness or near syncope.  No hallucinations during the day but sometimes if he is laying there at night he may see a "girl and a guy" in the room.  It goes away quickly if he blinks.  It is not scary so more bothersome to him.   Observations/Objective:   GEN:  The patient appears stated age and is in NAD.  Neurological examination:  Orientation: The patient is alert and oriented x3. Cranial nerves: There is good facial symmetry. There is facial hypomimia.  The speech is fluent and clear. Soft palate rises symmetrically and  there is no tongue deviation. Hearing is intact to conversational tone. Motor: Strength is at least antigravity x 4.   Shoulder shrug is equal and symmetric.  There is no pronator drift.  Movement examination: Tone: unable Abnormal movements: None noted Coordination:  There is  decremation with RAM's, with finger taps and hand opening and closing bilaterally.  I was unable to see feet to assess for toe taps and heel taps bilaterally Gait and Station: The patient is stooped at the waist.  He has decreased arm swing bilaterally.     Chemistry      Component Value Date/Time   NA 142 02/16/2018 0829   K 4.0 02/16/2018 0829   CL 104 02/16/2018 0829   CO2 27 02/16/2018 0829   BUN 11 02/16/2018 0829   CREATININE 0.95 02/16/2018 0829      Component Value Date/Time   CALCIUM 9.3 02/16/2018 0829   AST 18 02/16/2018 0829   ALT 14 02/16/2018 0829   BILITOT 0.6 02/16/2018 0829     Lab Results  Component Value Date   TSH 3.20 02/16/2018    Assessment and Plan:   1.  Idiopathic Parkinson's disease.  The patient has tremor, bradykinesia, rigidity and mild postural instability.  Dx: 05/05/17 but suspect that has had disease for 4 years (onset of unilateral tremor)             -We discussed the diagnosis as well as pathophysiology of the disease.  We discussed treatment options as well as prognostic indicators.  Patient education was provided.             -We discussed that it used to be thought that levodopa would increase risk of melanoma but now it is believed that Parkinsons itself likely increases risk of melanoma. he is to get regular skin checks.             -Discussed importance of increasing exercise and discussed several times previously as well.             -Patient is spreading out his levodopa throughout the day, but he does stay at past midnight every day.  Decided to slightly increase his medication so that he takes carbidopa/levodopa 25/100, 1 tablet at 9 AM/1 PM/5 PM/9 PM.  He  expressed understanding.  His cousin also did.  Refills were sent to the pharmacy.             -continue pramipexole, 0.5 mg tid.      -Discussed in detail Covid-19 and risk factors for this.  Discussed importance of social distancing.  Discussed importance of staying home at all times, as is feasible.   Pt expressed understanding.  Follow Up Instructions:    -I discussed the assessment and treatment plan with the patient. The patient was provided an opportunity to ask questions and all were answered. The patient agreed with the plan and demonstrated an understanding of the instructions.  I will plan on seeing him back in the office in the next 4 months, sooner should new neurologic issues arise.   The patient was advised to call back or seek an in-person evaluation if the symptoms worsen or if the condition fails to improve as anticipated.    Total Time spent in visit with the patient was:  25 min, of which 100% of the time was spent in counseling and/or coordinating care on safety.   Pt understands and agrees with the plan of care outlined.     Kerin Salen, DO

## 2018-06-26 NOTE — Telephone Encounter (Signed)
Will return call with Dr. Don Perking response.  Dr. Arbutus Leas - pls advise

## 2018-06-26 NOTE — Telephone Encounter (Signed)
It is most ideal for video or evisit as I would like to see how his medication is working.  Does he not have access to that?  If not, we will do the telephone visit.

## 2018-06-26 NOTE — Telephone Encounter (Signed)
Spoke with Britta Mccreedy, she states that pt does not have access to computer, tablet or smart phone.  Only has landline (?).  Barbara relayed pt's home phone number.  Updated # on appointment notes.

## 2018-06-29 ENCOUNTER — Encounter: Payer: Self-pay | Admitting: Neurology

## 2018-06-29 ENCOUNTER — Other Ambulatory Visit: Payer: Self-pay

## 2018-06-29 ENCOUNTER — Telehealth (INDEPENDENT_AMBULATORY_CARE_PROVIDER_SITE_OTHER): Payer: Medicaid Other | Admitting: Neurology

## 2018-06-29 ENCOUNTER — Telehealth: Payer: Self-pay

## 2018-06-29 DIAGNOSIS — G20 Parkinson's disease: Secondary | ICD-10-CM

## 2018-06-29 MED ORDER — CARBIDOPA-LEVODOPA 25-100 MG PO TABS: 1.0000 | ORAL_TABLET | Freq: Four times a day (QID) | ORAL | 1 refills | Status: DC

## 2018-06-29 NOTE — Telephone Encounter (Signed)
Called and spoke with Pt to confirm medications prior to virtual visit today at 2pm. I asked Pt about advanced directive, he asked I mail information for him and and his cousin Milford Cage to look over.   Pt did not have a way to check BP or temperature, though he stated someone is coming soon who does have a BP cuff and he can have the reading available at the 2pm call

## 2018-07-07 ENCOUNTER — Ambulatory Visit: Payer: Medicaid Other | Admitting: Neurology

## 2018-11-23 NOTE — Progress Notes (Signed)
Thomas Huber was seen today in follow up for Parkinsons disease.  Pt is currently on pramipexole, 0.5 mg 3 times per day and carbidopa/levodopa 25/100, which was increased last visit to qid dosing.  He states that he isn't sure that he noticed a difference with the change.  Pt denies falls.  He is noticing some tremor.   Pt denies lightheadedness, near syncope.  No hallucinations.  Mood has been good.  He is not exercising.     PREVIOUS MEDICATIONS: Sinemet and Mirapex  ALLERGIES:  No Known Allergies  CURRENT MEDICATIONS:  Outpatient Encounter Medications as of 11/24/2018  Medication Sig  . carbidopa-levodopa (SINEMET IR) 25-100 MG tablet Take 1 tablet by mouth 4 (four) times daily. 9am/1pm/5pm/9pm  . ibuprofen (ADVIL,MOTRIN) 200 MG tablet Take 200 mg by mouth as needed for headache.  . pramipexole (MIRAPEX) 0.5 MG tablet TAKE 1 TABLET BY MOUTH 3 TIMES DAILY.   No facility-administered encounter medications on file as of 11/24/2018.     PAST MEDICAL HISTORY:   Past Medical History:  Diagnosis Date  . Tremor     PAST SURGICAL HISTORY:  History reviewed. No pertinent surgical history.  SOCIAL HISTORY:   Social History   Socioeconomic History  . Marital status: Widowed    Spouse name: Not on file  . Number of children: 2  . Years of education: 5811  . Highest education level: 11th grade  Occupational History  . Occupation: unemployed  Social Needs  . Financial resource strain: Not on file  . Food insecurity    Worry: Not on file    Inability: Not on file  . Transportation needs    Medical: Not on file    Non-medical: Not on file  Tobacco Use  . Smoking status: Never Smoker  . Smokeless tobacco: Never Used  Substance and Sexual Activity  . Alcohol use: Yes    Comment: 1 beer every other day  . Drug use: No  . Sexual activity: Not on file  Lifestyle  . Physical activity    Days per week: Not on file    Minutes per session: Not on file  . Stress: Not on file   Relationships  . Social Musicianconnections    Talks on phone: Not on file    Gets together: Not on file    Attends religious service: Not on file    Active member of club or organization: Not on file    Attends meetings of clubs or organizations: Not on file    Relationship status: Not on file  . Intimate partner violence    Fear of current or ex partner: Not on file    Emotionally abused: Not on file    Physically abused: Not on file    Forced sexual activity: Not on file  Other Topics Concern  . Not on file  Social History Narrative  . Not on file    FAMILY HISTORY:   Family Status  Relation Name Status  . Mother  Alive  . Father  Deceased  . Son  Alive  . Daughter  Alive    ROS:  Review of Systems  Constitutional: Negative.   HENT: Negative.   Eyes: Negative.   Respiratory: Negative.   Cardiovascular: Negative.   Gastrointestinal: Negative.   Skin: Negative.     PHYSICAL EXAMINATION:    VITALS:   Vitals:   11/24/18 1319  BP: (!) 147/79  Pulse: 74  SpO2: 97%  Weight: 225 lb 6.4 oz (  102.2 kg)  Height: 5\' 7"  (1.702 m)    GEN:  The patient appears stated age and is in NAD. HEENT:  Normocephalic, atraumatic.  The mucous membranes are moist. The superficial temporal arteries are without ropiness or tenderness. CV:  RRR Lungs:  CTAB Neck/HEME:  There are no carotid bruits bilaterally.  Neurological examination:  Orientation: The patient is alert and oriented x3. Cranial nerves: There is good facial symmetry with facial hypomimia. The speech is fluent and clear. Soft palate rises symmetrically and there is no tongue deviation. Hearing is intact to conversational tone. Sensation: Sensation is intact to light touch throughout Motor: Strength is at least antigravity x4.  Movement examination: Tone: There is mod increased tone in the UE bilaterally Abnormal movements: there is LUE rest tremor and RUE resting tremor, independent of one another Coordination:  There is  decremation with RAM's, with any form of RAMS, including alternating supination and pronation of the forearm, hand opening and closing, finger taps, heel taps and toe taps bilaterally Gait and Station: The patient has no difficulty arising out of a deep-seated chair without the use of the hands. The patient's stride length is slightly decreased.     ASSESSMENT/PLAN:  1.  Parkinson's disease, diagnosed 05/05/2017, but likely had disease onset 4 years prior  -Take carbidopa/levodopa 25/100, 1 tablet at 9 AM/1 PM/5 PM/9 PM (goes to bed really late).  Still looks underdosed but I am questing whether meds taken as directed given refill numbers on the bottle.  Will check with pharmacy on RF.    -Take pramipexole 0.5 mg 3 times per day.  Wonder if taking right.  Still has 3 refills on the bottle and I last wrote RX in December for 6 month supply.  Pt states taking as directed.  If pharmacy verifies, may need to change meds next visit.  -declines PT today  2.  Follow up is anticipated in the next 4-6 months, sooner should new neurologic issues arise.  Much greater than 50% of this visit was spent in counseling and coordinating care.  Total face to face time:  25 min  Cc:  Arsenio Katz, NP

## 2018-11-24 ENCOUNTER — Encounter: Payer: Self-pay | Admitting: Neurology

## 2018-11-24 ENCOUNTER — Ambulatory Visit (INDEPENDENT_AMBULATORY_CARE_PROVIDER_SITE_OTHER): Payer: Medicaid Other | Admitting: Neurology

## 2018-11-24 ENCOUNTER — Other Ambulatory Visit: Payer: Self-pay

## 2018-11-24 VITALS — BP 147/79 | HR 74 | Ht 67.0 in | Wt 225.4 lb

## 2018-11-24 DIAGNOSIS — G20 Parkinson's disease: Secondary | ICD-10-CM | POA: Diagnosis not present

## 2018-11-24 NOTE — Patient Instructions (Addendum)
Make sure that you are exercising!!  Take your pramipexole 0.5 mg three times per day Take your carbidopa/levodopa 25/100 at 9am/1pm/5pm/9pm  Tell your cousin I said hello!  The physicians and staff at [facility] are committed to providing excellent care. You may receive a survey requesting feedback about your experience at our office. We strive to receive "very good" responses to the survey questions. If you feel that your experience would prevent you from giving the office a "very good " response, please contact our office to try to remedy the situation. We may be reached at [phone]. Thank you for taking the time out of your busy day to complete the survey.

## 2018-12-29 ENCOUNTER — Other Ambulatory Visit: Payer: Self-pay | Admitting: Neurology

## 2019-03-12 ENCOUNTER — Other Ambulatory Visit: Payer: Self-pay | Admitting: Neurology

## 2019-03-18 ENCOUNTER — Emergency Department (HOSPITAL_COMMUNITY)
Admission: EM | Admit: 2019-03-18 | Discharge: 2019-03-18 | Disposition: A | Discharge status: Home / Self Care | Payer: Medicaid Other | Attending: Emergency Medicine | Admitting: Emergency Medicine

## 2019-03-18 ENCOUNTER — Encounter (HOSPITAL_COMMUNITY): Payer: Self-pay

## 2019-03-18 ENCOUNTER — Other Ambulatory Visit: Payer: Self-pay

## 2019-03-18 ENCOUNTER — Emergency Department (HOSPITAL_COMMUNITY): Payer: Medicaid Other

## 2019-03-18 DIAGNOSIS — G20 Parkinson's disease: Secondary | ICD-10-CM | POA: Insufficient documentation

## 2019-03-18 DIAGNOSIS — F062 Psychotic disorder with delusions due to known physiological condition: Secondary | ICD-10-CM | POA: Diagnosis not present

## 2019-03-18 DIAGNOSIS — R17 Unspecified jaundice: Secondary | ICD-10-CM

## 2019-03-18 DIAGNOSIS — Z20828 Contact with and (suspected) exposure to other viral communicable diseases: Secondary | ICD-10-CM | POA: Insufficient documentation

## 2019-03-18 DIAGNOSIS — R4182 Altered mental status, unspecified: Secondary | ICD-10-CM

## 2019-03-18 DIAGNOSIS — Z79899 Other long term (current) drug therapy: Secondary | ICD-10-CM | POA: Insufficient documentation

## 2019-03-18 HISTORY — DX: Parkinson's disease: G20

## 2019-03-18 HISTORY — DX: Parkinson's disease without dyskinesia, without mention of fluctuations: G20.A1

## 2019-03-18 LAB — SALICYLATE LEVEL: Salicylate Lvl: <7 mg/dL — ABNORMAL LOW (ref 7.0–30.0)

## 2019-03-18 LAB — RESPIRATORY PANEL BY RT PCR (FLU A&B, COVID)
Influenza A by PCR: NEGATIVE
Influenza B by PCR: NEGATIVE
SARS Coronavirus 2 by RT PCR: NEGATIVE

## 2019-03-18 LAB — COMPREHENSIVE METABOLIC PANEL
ALT: 42 U/L (ref 0–44)
AST: 55 U/L — ABNORMAL HIGH (ref 15–41)
Albumin: 4.6 g/dL (ref 3.5–5.0)
Alkaline Phosphatase: 62 U/L (ref 38–126)
Anion gap: 15 (ref 5–15)
BUN: 17 mg/dL (ref 8–23)
CO2: 20 mmol/L — ABNORMAL LOW (ref 22–32)
Calcium: 9.4 mg/dL (ref 8.9–10.3)
Chloride: 102 mmol/L (ref 98–111)
Creatinine, Ser: 1.06 mg/dL (ref 0.61–1.24)
GFR calc Af Amer: >60 mL/min (ref >60)
GFR calc non Af Amer: >60 mL/min (ref >60)
Glucose, Bld: 104 mg/dL — ABNORMAL HIGH (ref 70–99)
Potassium: 3.6 mmol/L (ref 3.5–5.1)
Sodium: 137 mmol/L (ref 135–145)
Total Bilirubin: 2.6 mg/dL — ABNORMAL HIGH (ref 0.3–1.2)
Total Protein: 8 g/dL (ref 6.5–8.1)

## 2019-03-18 LAB — PROTIME-INR
INR: 1 (ref 0.8–1.2)
Prothrombin Time: 13.4 seconds (ref 11.4–15.2)

## 2019-03-18 LAB — CBC WITH DIFFERENTIAL/PLATELET
Abs Immature Granulocytes: 0.03 10*3/uL (ref 0.00–0.07)
Basophils Absolute: 0.1 10*3/uL (ref 0.0–0.1)
Basophils Relative: 1 %
Eosinophils Absolute: 0 10*3/uL (ref 0.0–0.5)
Eosinophils Relative: 0 %
HCT: 45.2 % (ref 39.0–52.0)
Hemoglobin: 15.2 g/dL (ref 13.0–17.0)
Immature Granulocytes: 0 %
Lymphocytes Relative: 14 %
Lymphs Abs: 1.2 10*3/uL (ref 0.7–4.0)
MCH: 31.1 pg (ref 26.0–34.0)
MCHC: 33.6 g/dL (ref 30.0–36.0)
MCV: 92.6 fL (ref 80.0–100.0)
Monocytes Absolute: 0.8 10*3/uL (ref 0.1–1.0)
Monocytes Relative: 9 %
Neutro Abs: 6.9 10*3/uL (ref 1.7–7.7)
Neutrophils Relative %: 76 %
Platelets: 170 10*3/uL (ref 150–400)
RBC: 4.88 MIL/uL (ref 4.22–5.81)
RDW: 12.5 % (ref 11.5–15.5)
WBC: 8.9 10*3/uL (ref 4.0–10.5)
nRBC: 0 % (ref 0.0–0.2)

## 2019-03-18 LAB — AMMONIA: Ammonia: 19 umol/L (ref 9–35)

## 2019-03-18 LAB — ETHANOL: Alcohol, Ethyl (B): <10 mg/dL (ref <10)

## 2019-03-18 LAB — ACETAMINOPHEN LEVEL: Acetaminophen (Tylenol), Serum: <10 ug/mL — ABNORMAL LOW (ref 10–30)

## 2019-03-18 LAB — LACTIC ACID, PLASMA: Lactic Acid, Venous: 1.4 mmol/L (ref 0.5–1.9)

## 2019-03-18 LAB — CBG MONITORING, ED: Glucose-Capillary: 105 mg/dL — ABNORMAL HIGH (ref 70–99)

## 2019-03-18 MED ORDER — SODIUM CHLORIDE 0.9 % IV SOLN: INTRAVENOUS | Status: DC

## 2019-03-18 NOTE — ED Notes (Signed)
Pt encouraged to get urine sample. Pt trying to obtain.

## 2019-03-18 NOTE — ED Notes (Signed)
Patient transported to CT 

## 2019-03-18 NOTE — BH Assessment (Signed)
Tele Assessment Note   Patient Name: Thomas Huber MRN: [id number] Referring Physician: Dr. Noemi Chapel, MD Location of Patient: [facility] Location of Provider: [place]  Thomas Huber is a 63 y.o. male brought to [facility] by EMS to be evaluated due to showing signs of an altered mental state.  Pt states, "I was having a party with witches and I snuck out.  I got caught playing in the rain for 6 hours."  Pt denies recent alcohol use.  Pt states, "I get a 12 pack of beer on the 1st of the month when I get my check but I haven't had anything to drink since then."  Pt denies any other substance use.  Pt denies SI/HI/A/H-hallucinations.    Pt reports that he resides with his mother, Thomas Huber.  Pt was able to identity his listed contact person, Thomas Huber as his cousin.  Pt was able to recall that he has 2 grown children who live in [place].  Pt receives disability income due to his Parkinson.  Pt denies a history of inpatient/outpatient MH/SA treatment.  Patient was wearing a hospital gown as well as street clothes and appeared appropriately groomed.  Pt was alert throughout the assessment.  Patient made fair eye contact and had abnormal psychomotor activity.  Patient spoke in a loud voice with pressured speech.  Pt expressed feeling fine.  Pt's affect appeared elevated and incongruent with stated mood. Pt's thought process was circumstantial.  Pt presented with poor insight and judgement.  Pt was able to reliably contract for safety.   Disposition: [facility] discussed case with [place] Provider, Priscille Loveless, NP who psych cleared pt and recommends pt follow up with neurology.    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 Parkinson disease ([place])   . Tremor     History reviewed. No pertinent surgical history.  Family History:  Family History  Problem  Relation Age of Onset  . Breast cancer Mother   . Bladder Cancer Father   . Healthy Son   . Healthy Daughter     Social History:  reports that he has never smoked. He has never used smokeless tobacco. He reports current alcohol use. He reports that he does not use drugs.  Additional Social History:  Alcohol / Drug Use Pain Medications: See MARs Prescriptions: See MARs Over the Counter: See MARs History of alcohol / drug use?: Yes Substance #1 Name of Substance 1: Alcohol 1 - Age of First Use: unknown 1 - Amount (size/oz): "a few beers" 1 - Frequency: socially 1 - Duration: ongoing 1 - Last Use / Amount: unknown  CIWA: CIWA-Ar BP: 122/68 Pulse Rate: 76 COWS:    Allergies: No Known Allergies  Home Medications: (Not in a hospital admission)   OB/GYN Status:  No LMP for male patient.  General Assessment Data Location of Assessment: [facility] TTS Assessment: In system Is this a Tele or Face-to-Face Assessment?: Tele Assessment Is this an Initial Assessment or a Re-assessment for this encounter?: Initial Assessment Patient Accompanied by:: N/A Language Other than English: No Living Arrangements: Other (Comment)(Pt states he live with his mother) What gender do you identify as?: Male Marital status: Widowed Living Arrangements: Parent Can pt return to current living arrangement?: Yes Admission Status: Voluntary Is patient capable of signing voluntary admission?: Yes     Crisis Care Plan Living Arrangements:  Parent Legal Guardian: Other:(self) Name of Psychiatrist: None Name of Therapist: None  Education Status Is patient currently in school?: No Is the patient employed, unemployed or receiving disability?: Receiving disability income  Risk to self with the past 6 months Suicidal Ideation: No Has patient been a risk to self within the past 6 months prior to admission? : No Suicidal Intent: No Has patient had any suicidal intent within the past 6 months prior to  admission? : No Is patient at risk for suicide?: No Suicidal Plan?: No Has patient had any suicidal plan within the past 6 months prior to admission? : No Access to Means: No Previous Attempts/Gestures: No Triggers for Past Attempts: None known Intentional Self Injurious Behavior: None Family Suicide History: Unknown Persecutory voices/beliefs?: No Depression: No Substance abuse history and/or treatment for substance abuse?: No Suicide prevention information given to non-admitted patients: Not applicable  Risk to Others within the past 6 months Homicidal Ideation: No Does patient have any lifetime risk of violence toward others beyond the six months prior to admission? : No Thoughts of Harm to Others: No Current Homicidal Intent: No Current Homicidal Plan: No Access to Homicidal Means: No History of harm to others?: No Assessment of Violence: None Noted Does patient have access to weapons?: No Criminal Charges Pending?: No Does patient have a court date: No Is patient on probation?: No  Psychosis Hallucinations: None noted Delusions: Unspecified  Mental Status Report Appearance/Hygiene: In hospital gown Eye Contact: Fair Motor Activity: Restlessness Speech: Pressured, Loud Level of Consciousness: Alert Mood: Preoccupied Affect: Preoccupied Anxiety Level: None Thought Processes: Circumstantial Judgement: Partial Orientation: Person, Situation Obsessive Compulsive Thoughts/Behaviors: Minimal  Cognitive Functioning Concentration: Decreased Memory: Recent Intact, Remote Intact Is patient IDD: No Insight: Unable to Assess Impulse Control: Unable to Assess Appetite: Fair Have you had any weight changes? : No Change Sleep: Unable to Assess Vegetative Symptoms: None  ADLScreening [facility] Assessment Services) Patient's cognitive ability adequate to safely complete daily activities?: Yes Patient able to express need for assistance with ADLs?: Yes Independently performs  ADLs?: Yes (appropriate for developmental age)  Prior Inpatient Therapy Prior Inpatient Therapy: No  Prior Outpatient Therapy Prior Outpatient Therapy: No Does patient have an ACCT team?: No Does patient have Intensive In-House Services?  : No Does patient have [facility] services? : No Does patient have P4CC services?: No  ADL Screening (condition at time of admission) Patient's cognitive ability adequate to safely complete daily activities?: Yes Is the patient deaf or have difficulty hearing?: No Does the patient have difficulty seeing, even when wearing glasses/contacts?: No Does the patient have difficulty concentrating, remembering, or making decisions?: Yes Patient able to express need for assistance with ADLs?: Yes Does the patient have difficulty dressing or bathing?: No Independently performs ADLs?: Yes (appropriate for developmental age) Does the patient have difficulty walking or climbing stairs?: No Weakness of Legs: None Weakness of Arms/Hands: None  Home Assistive Devices/Equipment Home Assistive Devices/Equipment: None          Advance Directives (For Healthcare) Does Patient Have a Medical Advance Directive?: No Nutrition Screen- [facility] Adult/[facility] Patient's home diet: NPO        [facility], Malachy Chamberakia Starkes, NP who psych cleared pt and recommends pt follow up with neurology.     Disposition Initial Assessment Completed for this Encounter: Yes Disposition of Patient: Discharge(Psych cleared per Malachy Chamberakia Starkes, NP) Patient refused recommended treatment: No  This service was provided via telemedicine using a 2-way, interactive audio  and Immunologist.  Names of all persons participating in this telemedicine service and their role in this encounter. Name: Thomas Huber Role: Patient  Name: Tyron Russell, MS, [facility] Role: Triage Specialist  Name: Malachy Chamber, NP Role: [facility] Provider  Name:  Role:     Tyron Russell,  MS, [facility], NCC 03/18/2019 1:16 PM

## 2019-03-18 NOTE — ED Triage Notes (Signed)
Pt brought to ED via [place] by neighbor for altered mental status. CBG 141. Per neighbor, pt wandering outside. Pt states he was having a party with witches and having a ritual.

## 2019-03-18 NOTE — ED Notes (Signed)
TTS in progress 

## 2019-03-18 NOTE — BHH Counselor (Signed)
  BH ASSESSMENT DISPOSITION  [facility] discussed case with [place] Provider, Priscille Loveless, NP who psych cleared pt and recommends pt follow up with neurology.   Thomas Huber L. Foley, [place], [facility], [facility] Therapeutic Triage Specialist  [phone]

## 2019-03-18 NOTE — ED Notes (Signed)
Pt urinated all over himself and bed. Linens and gown changed. Pericare performed.

## 2019-03-18 NOTE — Discharge Instructions (Signed)
Please return to the emergency department for increasing or worsening symptoms including chest pain, shortness of breath, fevers or chills.  If you have abdominal pain nausea or vomiting please return as well.  Please be aware that you had an abnormal blood test, your bilirubin was slightly elevated, this is related to your liver and it needs to be rechecked within 2 weeks.  Additionally if you feel like you are having increased hallucinations or are starting to feel like you are suicidal or homicidal please return to the emergency department immediately.

## 2019-03-18 NOTE — ED Notes (Signed)
Pt given sprite 

## 2019-03-18 NOTE — ED Provider Notes (Signed)
[facility] EMERGENCY DEPARTMENT Provider Note   CSN: [id number] Arrival date & time: 03/18/19  6962     History Chief Complaint  Patient presents with  . Altered Mental Status    Thomas Huber is a 63 y.o. male.  HPI   This patient is a very pleasant 63 year old male with a known history of Parkinson's disease.  There is very little else in the medical record other than his history of Parkinson's disease and follow-up with neurology.  He was last seen by neurology on November 24, 2018 and the encounter at that time records that he was on pramipexole and carbidopa levodopa.  The patient presents with a questionable altered mental status.  He presents by ambulance transport and they are the primary historians report that the patient was found walking around outside outside of his apartment.  It is unclear how long he was out there (the patient states he has been walking around for a day and a half) he was found by a neighbor who knew him and brought him into his apartment and called 911 because he was "talking out of his head".  The patient was awake alert and had unremarkable vital signs.  He is known to have a baseline tremor and difficulty ambulating but was found ambulating without any history of falls or evidence of trauma.  The patient tells me that he was in [place] at a party with witches - they had the own "police force".  He states that he drinks occasionally but hasn't had any of the "good stuff" recently.  He denies headache, visual changes, cough, or SOB or any trauma or other c/o - no recent diarrhea and normal appetite.  The pt continually talks about how this is a "true story even though it sounds crazy".    VS were reportedly normal prehospital.  Past Medical History:  Diagnosis Date  . Parkinson disease (HCC)   . Tremor     Patient Active Problem List   Diagnosis Date Noted  . Parkinson's disease (HCC) 05/05/2017    History reviewed. No pertinent  surgical history.     Family History  Problem Relation Age of Onset  . Breast cancer Mother   . Bladder Cancer Father   . Healthy Son   . Healthy Daughter     Social History   Tobacco Use  . Smoking status: Never Smoker  . Smokeless tobacco: Never Used  Substance Use Topics  . Alcohol use: Yes    Comment: 1 beer every other day  . Drug use: No    Home Medications Prior to Admission medications   Medication Sig Start Date End Date Taking? Authorizing Provider  carbidopa-levodopa (SINEMET IR) 25-100 MG tablet TAKE 1 TABLET BY MOUTH 4 TIMES DAILY AT 9AM, 1PM, 5PM, & 9PM. 12/29/18  Yes Tat, Octaviano Batty, DO  ibuprofen (ADVIL,MOTRIN) 200 MG tablet Take 200 mg by mouth as needed for headache.   Yes [provider]  pramipexole (MIRAPEX) 0.5 MG tablet TAKE 1 TABLET BY MOUTH 3 TIMES DAILY. 03/12/19  Yes Tat, Octaviano Batty, DO    Allergies    Patient has no known allergies.  Review of Systems   Review of Systems  All other systems reviewed and are negative.   Physical Exam Updated Vital Signs BP 131/77   Pulse 82   Temp 97.9 F (36.6 C) (Oral)   Resp 20   Ht 1.727 m (5\' 8" )   Wt 95.3 kg   SpO2 94%  BMI 31.93 kg/m   Physical Exam Vitals and nursing note reviewed.  Constitutional:      General: He is not in acute distress.    Appearance: He is well-developed.  HENT:     Head: Normocephalic and atraumatic.     Comments: The entire scalp was palpated without tenderness or signs of injury, no malocclusion or signs of facial trauma    Mouth/Throat:     Pharynx: No oropharyngeal exudate.     Comments: The mouth and the posterior pharynx appear unremarkable Eyes:     General: No scleral icterus.       Right eye: No discharge.        Left eye: No discharge.     Conjunctiva/sclera: Conjunctivae normal.     Pupils: Pupils are equal, round, and reactive to light.     Comments: The pupils are equal round and reactive to light  Neck:     Thyroid: No thyromegaly.      Vascular: No JVD.  Cardiovascular:     Rate and Rhythm: Normal rate and regular rhythm.     Heart sounds: Normal heart sounds. No murmur. No friction rub. No gallop.   Pulmonary:     Effort: Pulmonary effort is normal. No respiratory distress.     Breath sounds: Normal breath sounds. No wheezing or rales.  Abdominal:     General: Bowel sounds are normal. There is no distension.     Palpations: Abdomen is soft. There is no mass.     Tenderness: There is no abdominal tenderness.  Musculoskeletal:        General: Tenderness present. Normal range of motion.     Cervical back: Normal range of motion and neck supple. No tenderness.     Comments: The patient is able to fully range all 4 extremities, his gait is stuttering and staggering, this appears to be normal for the patient.  He is able to get off of the stretcher from the paramedics and with minimal assistance walk over to the bed.  He is able to get on the bed himself and position himself securely on the bed.  He does have a bit of a tremor and this does take some time  Lymphadenopathy:     Cervical: No cervical adenopathy.  Skin:    General: Skin is warm and dry.     Findings: No erythema or rash.  Neurological:     Mental Status: He is alert.     Coordination: Coordination normal.     Comments: The patient is able to follow commands, he has a mild tremor, using all 4 extremities, speech is clear, coordination seems to be normal except for mild tremor, mental status is alert, he is maintaining his story about what happened over the last 24 hours  Psychiatric:        Behavior: Behavior normal.     ED Results / Procedures / Treatments   Labs (all labs ordered are listed, but only abnormal results are displayed) Labs Reviewed  COMPREHENSIVE METABOLIC PANEL - Abnormal; Notable for the following components:      Result Value   CO2 20 (*)    Glucose, Bld 104 (*)    AST 55 (*)    Total Bilirubin 2.6 (*)    All other components within  normal limits  SALICYLATE LEVEL - Abnormal; Notable for the following components:   Salicylate Lvl <7.0 (*)    All other components within normal limits  ACETAMINOPHEN LEVEL - Abnormal;  Notable for the following components:   Acetaminophen (Tylenol), Serum <10 (*)    All other components within normal limits  CBG MONITORING, ED - Abnormal; Notable for the following components:   Glucose-Capillary 105 (*)    All other components within normal limits  RESPIRATORY PANEL BY RT PCR (FLU A&B, COVID)  URINE CULTURE  CBC WITH DIFFERENTIAL/PLATELET  LACTIC ACID, PLASMA  AMMONIA  ETHANOL  PROTIME-INR  CBC WITH DIFFERENTIAL/PLATELET  URINALYSIS, ROUTINE W REFLEX MICROSCOPIC  RAPID URINE DRUG SCREEN, HOSP PERFORMED  CBG MONITORING, ED    EKG EKG Interpretation  Date/Time:  Thursday March 18 2019 08:53:05 EST Ventricular Rate:  83 PR Interval:    QRS Duration: 151 QT Interval:  368 QTC Calculation: 433 R Axis:   58 Text Interpretation: Sinus rhythm Short PR interval Right atrial enlargement Nonspecific intraventricular conduction delay Artifact in lead(s) I II III aVR aVL V1 No old tracing to compare undulating baseline Confirmed by Eber HongMiller, Trecia Maring ([id number]) on 03/18/2019 9:00:51 AM   Radiology CT Head Wo Contrast  Result Date: 03/18/2019 CLINICAL DATA:  Altered mental status. Encephalopathy. EXAM: CT HEAD WITHOUT CONTRAST TECHNIQUE: Contiguous axial images were obtained from the base of the skull through the vertex without intravenous contrast. COMPARISON:  None. FINDINGS: Brain: Mild-to-moderate enlargement of the ventricles and subarachnoid spaces. No intracranial hemorrhage, mass lesion or CT evidence of acute infarction. Vascular: No hyperdense vessel or unexpected calcification. Skull: Normal. Negative for fracture or focal lesion. Sinuses/Orbits: Mild left maxillary sinus wall thickening and surgical absence of a portion of the medial wall of the left maxillary sinus, compatible with  previous changes of chronic sinusitis and subsequent surgery. Unremarkable orbits. Other: None. IMPRESSION: 1. No acute abnormality. 2. 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 Electronically Signed   By: Beckie SaltsSteven  Reid M.D.   On: 03/18/2019 11:19    Procedures Procedures (including critical care time)  Medications Ordered in ED Medications  0.9 %  sodium chloride infusion ( Intravenous New Bag/Given 03/18/19 [id number])    ED Course  I have reviewed the triage vital signs and the nursing notes.  Pertinent labs & imaging results that were available during my care of the patient were reviewed by me and considered in my medical decision making (see chart for details).    MDM Rules/Calculators/A&P                      The patient presents with a rather bizarre story, he seems to not be at his baseline according to what the paramedics report from the friend who knows him at his apartment building.  He states he has been walking around for the last day and a half in the rain yet his close are only mildly damp, he has no signs of trauma, would question substances, infection, metabolic disturbance, this could also be related to his medications.  Will check urinalysis, blood sugar, labs, EKG, placed on a cardiac monitor.  Labs are fairly unremarkable, his bilirubin is slightly elevated at 2.6, he has no abdominal tenderness whatsoever.  The patient will need to be evaluated in the outpatient setting most likely by neurology however at this time he is not actively hallucinating, he is not having any thoughts of harm to himself or others and is stable for discharge.  He has been cleared by psychiatry and medically the patient checks out well at this time.  Vital signs are reassuring, labs are reassuring, the patient will be updated and informed of  his elevated bilirubin level and both written and verbal form.  Final Clinical Impression(s) / ED Diagnoses Final diagnoses:  Altered mental  status, unspecified altered mental status type  Elevated bilirubin    Rx / DC Orders ED Discharge Orders    None       Noemi Chapel, MD 03/18/19 [phone]

## 2019-03-22 ENCOUNTER — Telehealth: Payer: Self-pay | Admitting: Neurology

## 2019-03-22 NOTE — Telephone Encounter (Signed)
Patient's cousin, Randye Lobo, called to report the patient was seen at [facility] on 03/18/19 for increased hallucinations. She said he has Parkinson's Disease and she'd like Dr. Carles Collet to evaluate him.

## 2019-03-22 NOTE — Telephone Encounter (Signed)
I would schedule follow up with Dr. Carles Collet thanks,

## 2019-03-23 NOTE — Telephone Encounter (Signed)
I need some scheduling guidelines please? Does the patient need to be seen in the office or is a virtual visit okay? How soon does the patient need to be seen?

## 2019-03-23 NOTE — Telephone Encounter (Signed)
I would offer VV in a few weeks for followup since she was seen in the ED. If she perfers, Dr. Carles Collet will be back in office Thursday, she can call back. Thanks

## 2019-03-23 NOTE — Telephone Encounter (Signed)
Patient scheduled with Dr. Carles Collet on 04/13/2019 at 3:00 PM for a virtual visit. He has been added to the wait list, by request.

## 2019-04-08 ENCOUNTER — Other Ambulatory Visit: Payer: Self-pay | Admitting: Neurology

## 2019-04-12 NOTE — Progress Notes (Signed)
Patient seen today in follow-up for Parkinson's disease. Cousin present and supplements hx.   Pt denies falls.  Pt denies lightheadedness, near syncope.  No hallucinations.  Mood has been good.  My previous records as well as any outside records made available were reviewed prior to todays visit.  Patient was in the emergency room on December 24 with mental status change.  Patient was ambulating outside his apartment and told the emergency room that he was at a party with witches (although today pt states that he was in a jail with indians).  While hospital work-up was negative, they admitted he was not at his baseline and discharged him and said to follow-up with outpatient neurology. Interestingly, the psychiatric nurse practitioner cleared the patient and recommended he follow-up with neurology.  CT of the head was completed.  I personally reviewed this.  There is mild to moderate atrophy, but was otherwise unremarkable.  Ammonia level was normal.   Pt states that he did take 3 benadryl prior to the episode hoping to get to sleep and drank beer.  He doesn't usually do either of those things.  Pt does tell me that he has been having hallucinations for a few months but he decided to just wait for this appt to tell me.  He is seeing people but the people don't have legs/arms, so he knows tha they are not real.  The people never speak.    Current movement d/o meds:  Carbidopa/levodopa 25/100, 1 tablet at 9 AM/1 PM/5 PM/9 PM Pramipexole 0.5 mg 3 times per day   Current Outpatient Medications on File Prior to Visit  Medication Sig Dispense Refill  . carbidopa-levodopa (SINEMET IR) 25-100 MG tablet TAKE 1 TABLET BY MOUTH 4 TIMES DAILY AT 9AM, 1PM, 5PM, & 9PM. 360 tablet 1  . ibuprofen (ADVIL,MOTRIN) 200 MG tablet Take 200 mg by mouth as needed for headache.    . pramipexole (MIRAPEX) 0.5 MG tablet TAKE 1 TABLET BY MOUTH 3 TIMES DAILY. 90 tablet 0   No current facility-administered medications on file  prior to visit.     Observations/Objective:   Vitals:   04/13/19 1451  BP: 121/75  Pulse: 88  Resp: 16  SpO2: 98%  Weight: 215 lb 9.6 oz (97.8 kg)  Height: 5\' 7"  (1.702 m)   GEN:  The patient appears stated age and is in NAD.  Neurological examination:  Orientation: The patient is alert and oriented x3. Cranial nerves: There is good facial symmetry. There is facial hypomimia.  The speech is fluent and clear. Soft palate rises symmetrically and there is no tongue deviation. Hearing is intact to conversational tone. Motor: Strength is at least antigravity x 4.   Shoulder shrug is equal and symmetric.  There is no pronator drift.  Movement examination: Tone: there is increased tone (mod) in the LUE Abnormal movements: none Coordination:  There is  decremation with RAM's, with any form of RAMS, including alternating supination and pronation of the forearm, hand opening and closing, finger taps, heel taps and toe taps, L more than R Gait and Station: The patient has no difficulty arising out of a deep-seated chair without the use of the hands. The patient's stride length is good but stooped posture.    I have reviewed and interpreted the following labs independently     Chemistry      Component Value Date/Time   NA 137 03/18/2019 0858   K 3.6 03/18/2019 0858   CL 102 03/18/2019  0858   CO2 20 (L) 03/18/2019 0858   BUN 17 03/18/2019 0858   CREATININE 1.06 03/18/2019 0858   CREATININE 0.95 02/16/2018 0829      Component Value Date/Time   CALCIUM 9.4 03/18/2019 0858   ALKPHOS 62 03/18/2019 0858   AST 55 (H) 03/18/2019 0858   ALT 42 03/18/2019 0858   BILITOT 2.6 (H) 03/18/2019 0858     Lab Results  Component Value Date   TSH 3.20 02/16/2018     Assessment and Plan:   1.  Parkinson's disease  -Stressed the importance of taking medications properly.    -over next 3 weeks will slowly increase carbidopa/levodopa 25/100, 2 tablets at 9am/2 tablets at 1 pm/1 tablet at 5  pm/1 tablet at 9pm  -wean off of pramipexole d/t hallucinations over next 3 weeks  2.  Episode of mental status change  -likely due to taking 3 benadryl with beer in combination with baseline hallucinations  -hallucinations probably from pramipexole and PD   Follow Up Instructions:  8 weeks   -I discussed the assessment and treatment plan with the patient. The patient was provided an opportunity to ask questions and all were answered. The patient agreed with the plan and demonstrated an understanding of the instructions.   The patient was advised to call back or seek an in-person evaluation if the symptoms worsen or if the condition fails to improve as anticipated.    Total time spent on today's visit was 45 minutes, including both face-to-face time and nonface-to-face time.  Time included that spent on review of records (prior notes available to me/labs/imaging if pertinent), discussing treatment and goals, answering patient's questions and coordinating care.   Kerin Salen, DO

## 2019-04-13 ENCOUNTER — Ambulatory Visit (INDEPENDENT_AMBULATORY_CARE_PROVIDER_SITE_OTHER): Payer: Medicaid Other | Admitting: Neurology

## 2019-04-13 ENCOUNTER — Other Ambulatory Visit: Payer: Self-pay

## 2019-04-13 ENCOUNTER — Encounter: Payer: Self-pay | Admitting: Neurology

## 2019-04-13 VITALS — BP 121/75 | HR 88 | Resp 16 | Ht 67.0 in | Wt 215.6 lb

## 2019-04-13 DIAGNOSIS — G20 Parkinson's disease: Secondary | ICD-10-CM | POA: Diagnosis not present

## 2019-04-13 DIAGNOSIS — R441 Visual hallucinations: Secondary | ICD-10-CM

## 2019-04-13 DIAGNOSIS — R4182 Altered mental status, unspecified: Secondary | ICD-10-CM

## 2019-04-13 MED ORDER — CARBIDOPA-LEVODOPA 25-100 MG PO TABS: ORAL_TABLET | ORAL | 1 refills | Status: DC

## 2019-04-13 NOTE — Patient Instructions (Addendum)
1.  WEEK 1:  -decrease pramipexole to 0.5 mg twice per day 2.  WEEK 2:  -decrease pramipexole to 0.5 mg once per day  -increase carbidopa/levodopa 25/100, 2 tablets at 9am/1 tablet at 1pm/1 tablet at 5pm/1 tablet at 9pm 3.  WEEK 3:  -STOP pramipexole  -increase carbidopa/levodopa 25/100, 2 tablets at 9am/2 tablets at 1 pm/1 tablet at 5 pm/1 tablet at 9pm

## 2019-04-30 ENCOUNTER — Telehealth: Payer: Medicaid Other | Admitting: Neurology

## 2019-05-18 ENCOUNTER — Telehealth: Payer: Self-pay | Admitting: Neurology

## 2019-05-18 NOTE — Telephone Encounter (Signed)
Patient called in regarding needing a refill on his Carbidopa Levodopa. He said he only has 6 days of medication left. [facility]. Please Call. Thanks

## 2019-05-19 NOTE — Telephone Encounter (Signed)
Spoke with pharmacist at [facility] and he stated patient has a refill on hand; he states patient just needs to contact them and state if he wants his rx delivered or if he will pick it up.  Contacted patient and notified him that all he needs to do is contact the pharmacy. He voiced understanding and thanked me for the call.

## 2019-05-19 NOTE — Telephone Encounter (Signed)
Contacted patients pharmacy to see if a refill was needed. Advise pharmacy to contact the office if a new request was needed but if the patient had another refill on file for them to go ahead and refill the rx.   Awaiting a call back.

## 2019-06-08 NOTE — Progress Notes (Signed)
Assessment/Plan:   1.  Parkinsons Disease  -Take carbidopa/levodopa 25/100, 2 tablets at 9 AM/2 tablets at 1 PM/1 tablet at 5 PM/1 tablet at 9 PM.  Likely needs a slightly higher dosage.  -May change his bedtime tablet at 9 PM in the future to the CR form.  Did not want to do that, as I am just getting him to take medication properly and did not want to cause confusion.  -We will do lab work today including CBC, TSH, chemistry, urinalysis  2.  Hallucinations  -Are much improved compared to when he was on pramipexole.  -Because he is still having hallucinations, and because he likely needs an increased dose of levodopa, we decided to go ahead and pursue Nuplazid, 34 mg daily.  Discussed risk, benefits, and side effects, including the black box warning.  Samples were given today.  -We will do labs, as above.  -Discussed with patient that I want him to reduce his alcohol from 6 beers per week to 3 beers per week.  He does not abuse alcohol, but given the hallucinations and the fact that he has little support at home (his elderly cousin helps him), I want to reduce this is much as possible.  He was agreeable.  3.  F/u 3-4 months   Subjective:   Thomas Huber was seen today in follow up for Parkinsons disease.  Patient was weaned off of pramipexole after our last visit in January and his levodopa was slightly increased.  We did talk about making sure that medications were taken properly.  He reports that weaning off of medication made a marked improvement in hallucinations - "the room full of people went away."  He is still seeing a man out of the corner of his eye but when he stares right at it, it goes away.  That is mostly at night.  That seemed to start in the past 2 weeks. No new meds except what I changed.  No OTC meds.  Drinks beer 1-2 times per week (3 beers each time).   My previous records were reviewed prior to todays visit as well as outside records available to me. Pt denies falls.   Pt denies lightheadedness, near syncope.  Mood has been good.  Current prescribed movement disorder medications: Carbidopa/levodopa 25/100, 2 tablets at 9 AM/2 tablets at 1 PM/1 tablet at 5 PM/1 tablet at 9 PM   PREVIOUS MEDICATIONS: Mirapex  ALLERGIES:   Allergies  Allergen Reactions  . Drug Class [Antihistamines, Diphenhydramine-Type] Other (See Comments)    Benadryl-makes patient incoherent     CURRENT MEDICATIONS:  Outpatient Encounter Medications as of 06/10/2019  Medication Sig  . carbidopa-levodopa (SINEMET IR) 25-100 MG tablet 2 tablets at 9am/2 tablets at 1 pm/1 tablet at 5 pm/1 tablet at 9pm  . ibuprofen (ADVIL,MOTRIN) 200 MG tablet Take 200 mg by mouth as needed for headache.  . [DISCONTINUED] pramipexole (MIRAPEX) 0.5 MG tablet TAKE 1 TABLET BY MOUTH 3 TIMES DAILY. (Patient not taking: Reported on 06/10/2019)   No facility-administered encounter medications on file as of 06/10/2019.    Objective:   PHYSICAL EXAMINATION:    VITALS:   Vitals:   06/10/19 1343  BP: (!) 153/78  Pulse: 70  SpO2: 99%  Weight: 221 lb (100.2 kg)  Height: 5\' 7"  (1.702 m)    GEN:  The patient appears stated age and is in NAD. HEENT:  Normocephalic, atraumatic.  The mucous membranes are moist. The superficial temporal arteries are without ropiness  or tenderness. CV:  RRR Lungs:  CTAB Neck/HEME:  There are no carotid bruits bilaterally.  Neurological examination:  Orientation: The patient is alert and oriented x3. Cranial nerves: There is good facial symmetry with  facial hypomimia. The speech is fluent and clear. Soft palate rises symmetrically and there is no tongue deviation. Hearing is intact to conversational tone. Sensation: Sensation is intact to light touch throughout Motor: Strength is at least antigravity x4.  Movement examination: Tone: There is mild to moderate increased tone in the right upper extremity (was worse last time in the left upper extremity) Abnormal  movements: None Coordination:  There is slowness decremation with RAM's, but the decremation is better than previous Gait and Station: The patient arises out of the chair without the use of his hands.  He is bent at the waist, but he walks very well in the hall.   Total time spent on today's visit was 45 minutes, including both face-to-face time and nonface-to-face time.  Time included that spent on review of records (prior notes available to me/labs/imaging if pertinent), discussing treatment and goals, answering patient's questions and coordinating care.  Cc:  Arsenio Katz, NP

## 2019-06-10 ENCOUNTER — Other Ambulatory Visit: Payer: Self-pay

## 2019-06-10 ENCOUNTER — Ambulatory Visit (INDEPENDENT_AMBULATORY_CARE_PROVIDER_SITE_OTHER): Payer: Medicaid Other | Admitting: Neurology

## 2019-06-10 ENCOUNTER — Encounter: Payer: Self-pay | Admitting: Neurology

## 2019-06-10 ENCOUNTER — Other Ambulatory Visit: Payer: Medicaid Other

## 2019-06-10 VITALS — BP 153/78 | HR 70 | Ht 67.0 in | Wt 221.0 lb

## 2019-06-10 DIAGNOSIS — G20 Parkinson's disease: Secondary | ICD-10-CM

## 2019-06-10 DIAGNOSIS — Z5181 Encounter for therapeutic drug level monitoring: Secondary | ICD-10-CM | POA: Diagnosis not present

## 2019-06-10 DIAGNOSIS — R441 Visual hallucinations: Secondary | ICD-10-CM

## 2019-06-10 MED ORDER — NUPLAZID 34 MG PO CAPS: 1.0000 | ORAL_CAPSULE | Freq: Every day | ORAL | 0 refills | Status: DC

## 2019-06-10 NOTE — Patient Instructions (Signed)
1. Start nuplazid, 34 mg daily 2.  Your provider has requested that you have labwork completed today. Please go to [facility] ([id number]) on the second floor of this building before leaving the office today. You do not need to check in. If you are not called within 15 minutes please check with the front desk.  3.  Decrease your beer to no more than 3 beers per week  The physicians and staff at [facility] are committed to providing excellent care. You may receive a survey requesting feedback about your experience at our office. We strive to receive "very good" responses to the survey questions. If you feel that your experience would prevent you from giving the office a "very good " response, please contact our office to try to remedy the situation. We may be reached at [phone]. Thank you for taking the time out of your busy day to complete the survey.

## 2019-06-11 LAB — COMPREHENSIVE METABOLIC PANEL
AG Ratio: 1.6 (calc) (ref 1.0–2.5)
ALT: 9 U/L (ref 9–46)
AST: 17 U/L (ref 10–35)
Albumin: 4.3 g/dL (ref 3.6–5.1)
Alkaline phosphatase (APISO): 59 U/L (ref 35–144)
BUN: 10 mg/dL (ref 7–25)
CO2: 27 mmol/L (ref 20–32)
Calcium: 9.5 mg/dL (ref 8.6–10.3)
Chloride: 102 mmol/L (ref 98–110)
Creat: 0.97 mg/dL (ref 0.70–1.25)
Globulin: 2.7 g/dL (calc) (ref 1.9–3.7)
Glucose, Bld: 94 mg/dL (ref 65–99)
Potassium: 4.7 mmol/L (ref 3.5–5.3)
Sodium: 138 mmol/L (ref 135–146)
Total Bilirubin: 0.7 mg/dL (ref 0.2–1.2)
Total Protein: 7 g/dL (ref 6.1–8.1)

## 2019-06-11 LAB — URINALYSIS, COMPLETE W/RFL CULTURE
Bacteria, UA: NONE SEEN /HPF
Bilirubin Urine: NEGATIVE
Glucose, UA: NEGATIVE
Hgb urine dipstick: NEGATIVE
Hyaline Cast: NONE SEEN /LPF
Ketones, ur: NEGATIVE
Leukocyte Esterase: NEGATIVE
Nitrites, Initial: NEGATIVE
Protein, ur: NEGATIVE
RBC / HPF: NONE SEEN /HPF (ref 0–2)
Specific Gravity, Urine: 1.014 (ref 1.001–1.03)
Squamous Epithelial / HPF: NONE SEEN /HPF (ref <=5)
WBC, UA: NONE SEEN /HPF (ref 0–5)
pH: 6 (ref 5.0–8.0)

## 2019-06-11 LAB — CBC
HCT: 44.1 % (ref 38.5–50.0)
Hemoglobin: 15.2 g/dL (ref 13.2–17.1)
MCH: 31.5 pg (ref 27.0–33.0)
MCHC: 34.5 g/dL (ref 32.0–36.0)
MCV: 91.3 fL (ref 80.0–100.0)
MPV: 11.1 fL (ref 7.5–12.5)
Platelets: 193 10*3/uL (ref 140–400)
RBC: 4.83 10*6/uL (ref 4.20–5.80)
RDW: 13.1 % (ref 11.0–15.0)
WBC: 7.2 10*3/uL (ref 3.8–10.8)

## 2019-06-11 LAB — TSH: TSH: 2.11 mIU/L (ref 0.40–4.50)

## 2019-06-11 LAB — NO CULTURE INDICATED

## 2019-06-18 ENCOUNTER — Telehealth: Payer: Self-pay

## 2019-06-18 NOTE — Telephone Encounter (Signed)
Recieved call from Nuplazid case manager, stating that Dr Tat is not in network for patient and if an network provider prescribes this medication it will be covered. He wants to know if Dr Tat is in network with medicaid and its just a glitch in the system.   Informed Rosanne Ashing that I would have to reach out to billing and get back to him. He voiced understanding.

## 2019-06-21 NOTE — Telephone Encounter (Signed)
Spoke with Dr Tat and she sent a message to Williemae Natter a head drug rep and asked him to take care of this . I will wait to hear back form him to see what he can do.

## 2019-06-22 ENCOUNTER — Telehealth: Payer: Self-pay

## 2019-06-22 MED ORDER — NUPLAZID 34 MG PO CAPS: 1.0000 | ORAL_CAPSULE | Freq: Every day | ORAL | 1 refills | Status: DC

## 2019-06-22 NOTE — Addendum Note (Signed)
Addended by: Kandice Robinsons T on: 06/22/2019 12:48 PM   Modules accepted: Orders

## 2019-06-22 NOTE — Telephone Encounter (Signed)
Received fax from [facility] requesting we send a new prescription with the patients address and phone number updated.   Will send new rx to specialty pharmacy.  Rx(s) sent to pharmacy electronically.

## 2019-07-01 ENCOUNTER — Telehealth: Payer: Self-pay | Admitting: Neurology

## 2019-07-01 ENCOUNTER — Encounter: Payer: Self-pay | Admitting: *Deleted

## 2019-07-01 NOTE — Telephone Encounter (Signed)
Have you seen any paperwork?

## 2019-07-01 NOTE — Telephone Encounter (Signed)
Thomas Huber with [employer] called and said [facility] does not have a PA for Nuplazid yet. Please send.

## 2019-07-01 NOTE — Telephone Encounter (Signed)
Submitted to [employer] today waiting for reply.

## 2019-07-01 NOTE — Progress Notes (Signed)
Confirmation #:[id number] WPrior Approval #:Status:SUSPENDED#:[id number] Armed forces operational officer #:Status:SUSPENDEDADULT SAFETY WITH ANTIPSYCHOTIC PRESCRIBING Requesting Provider Clinical Information Is it a request for a non-preferred drug?: Yes Drug Name: NUPLAZID Trial and Failure of only 1 preferred drug required?: No Previous episode of an unacceptable side effect or therapeutic failure.: Yes Please provide clinical information: Breyon Sigg was seen today in follow up for Parkinsons disease. Patient was weaned off of pramipexole after our last visit in January and his levodopa was slightly increased. 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 2H I certify that the information provided is accurate and complete to the best of my knowledge, and I understand that any falsification, omission, or concealment of material fact may subject me to civil or criminal liability.: Yes NCT[employer] Web Submitted Request for PHARMACY Prior Approval Confirmation Page Request Date: 07/01/2019 Confirmation Number: [id number] W Health Plan: [employer] Payer: [facility] [place] Type: PHARMACY Recipient ID: [id number] T Last Name: AZZARELLO First Name: [facility] Requesting Provider: [phone] Taxonomy Code: Address: [address] Prescribing Provider Number: [id number] Strength: 34MG  Quantity Per 30 Days: 30 Length of Therapy: 365 DAYS Total Quantity Requested: 360

## 2019-07-07 ENCOUNTER — Telehealth: Payer: Self-pay | Admitting: Neurology

## 2019-07-07 NOTE — Telephone Encounter (Signed)
Thomas Huber with [employer] called to help with an appeal on Nuplazid.

## 2019-07-08 NOTE — Telephone Encounter (Signed)
Spent twenty-four minutes on the phone with insurance company pharmacy. Started a new prior authorization. Will contact insurance tomorrow to check prior authorization status.   Insurance company pharmacy number [phone] when calling back to get approval information dial the number above then Press option 1 Press option 3 Press option 2 Press option 2  We will only get a response from the company if the medication is denied.   Will contact patient once I find out if the status.

## 2019-07-08 NOTE — Telephone Encounter (Signed)
Spoke with Rosanne Ashing and he stated Nuplazid has been denied because the patient needs to try one of the medications on the preferred list and fail before the patients insurance will cover the medication.

## 2019-07-08 NOTE — Telephone Encounter (Signed)
What are those medications?

## 2019-07-08 NOTE — Telephone Encounter (Signed)
Showed Dr Tat the medication medicaid preferred list.  She stated the medication on the preferred list can cause Parkinson's disease and the patient already has parkinson's Disease.   Contacted patients insurance to appeal denial.

## 2019-07-08 NOTE — Telephone Encounter (Signed)
For you review

## 2019-07-09 MED ORDER — QUETIAPINE FUMARATE 25 MG PO TABS: 25.0000 mg | ORAL_TABLET | Freq: Every day | ORAL | 0 refills | Status: DC

## 2019-07-09 NOTE — Telephone Encounter (Signed)
Dr Tat chose a rx and it will be sent to the pharmacy.  Left detailed message on patients cousin Marine scientist. Advised her to contact the office with any questions or concerns.  Patient notified and voiced understanding.

## 2019-07-09 NOTE — Telephone Encounter (Signed)
The list you gave me yesterday did not include those medications.  The list you showed me was only antipsychotics.  Can you show me the complete list of options to choose from?

## 2019-07-09 NOTE — Telephone Encounter (Signed)
Contacted patients insurance the rx was denied yesterday again.Patients  insurance is requiring patient try a medication on the preferred list and not have success before they will pay for the Nuplazid.

## 2019-07-09 NOTE — Telephone Encounter (Signed)
As I mentioned yesterday, all the recommended meds are contraindicated in this patient as they are antipsychotics and will make the Parkinsons Disease worse.  Literally, the patient cannot take ANY of them

## 2019-07-09 NOTE — Telephone Encounter (Addendum)
Spoke with the patients insurance company and was informed that the patient must try at least one of the medications on the list before the Nuplazid can be covered. The representative said the patient can do an appeal, but there is no peer to peer option. The representative stated if the patient has taken any of the medications on the preferred list that will help get his pre-authorization get approved. I explained that Dr Tat prefers not to prescribe any of the medication on the preferred list due to contraindications. She stated she is aware because there is a note on the PA and the Nuplazid is in the same drug class so it should have the same side effect as Nuplazid. The representative stated that we should try the patient on at least one of the medications per the state medicaid law and if the patient fails or is unsuccessful with the medication document this and this should allow the patient to get Nuplazid.  Contacted patient to see if he has ever taken any medication that is listed on the preferred list.     Informed patient that I was working on this and would get back to him once I hear some thing back. He voiced understanding.

## 2019-07-13 ENCOUNTER — Telehealth: Payer: Self-pay

## 2019-07-13 NOTE — Telephone Encounter (Signed)
Received call from patients cousin Milford Cage who states she called the pharmacy to pick up the rx but was informed that they have not received it. "if I have misunderstood you please give me a call back sugar. Thanks!"

## 2019-07-13 NOTE — Telephone Encounter (Signed)
Contacted pharmacy and he stated the rx was received on Friday but they stored the rx on the patients account instead of refilling the medication.   Left patients cousin Thomas Huber informing her that the pharmacy did receive the rx and will will contact her once the rx is ready to be picked up. Advised her to contact the office with any questions or concerns.

## 2019-07-23 ENCOUNTER — Telehealth: Payer: Self-pay | Admitting: Neurology

## 2019-07-23 NOTE — Telephone Encounter (Signed)
-----   Message from Octaviano Batty Celestino Ackerman, DO sent at 07/09/2019  2:30 PM EDT ----- Call patient and find out how doing on seroquel (quetiapine) since insurance declined the nuplazid.  Still having hallucinations?

## 2019-07-23 NOTE — Telephone Encounter (Signed)
Left message on Britta Mccreedy voicemail asking her how Gaines is doing and if he is still having hallucinations?   Waiting for a call back.

## 2019-07-26 NOTE — Telephone Encounter (Signed)
Thomas Huber is returning a call about the patient's medication after taking it for 7 days.

## 2019-07-26 NOTE — Telephone Encounter (Signed)
No answer at 07/26/2019 at 318

## 2019-07-27 NOTE — Telephone Encounter (Signed)
No answer at 808am

## 2019-07-28 NOTE — Telephone Encounter (Signed)
Left message to see how patient is doing on new rx. Awaiting a call back.

## 2019-07-30 NOTE — Telephone Encounter (Signed)
Left message for Britta Mccreedy to contact office.

## 2019-08-12 ENCOUNTER — Other Ambulatory Visit: Payer: Self-pay | Admitting: Neurology

## 2019-08-12 NOTE — Telephone Encounter (Signed)
You can fill this but would like to get ahold of barbara to see if helping.  See prior note

## 2019-08-13 ENCOUNTER — Other Ambulatory Visit: Payer: Self-pay | Admitting: Neurology

## 2019-08-13 NOTE — Telephone Encounter (Signed)
Left detailed message for patients cousin Britta Mccreedy to contact the office.   Rx(s) sent to pharmacy electronically.

## 2019-08-13 NOTE — Telephone Encounter (Signed)
Patient's cousin Britta Mccreedy called to report the patient is doing well on his Seroquel. FYI only.

## 2019-08-13 NOTE — Telephone Encounter (Signed)
Harriett Sine at [facility] called in stating a Safety Data Sheet needed to be filled out for this patient's Seroquel. The number for the Safety sheet is [phone]

## 2019-08-13 NOTE — Telephone Encounter (Signed)
Called [facility]  Reference [id number]  And completed the Safety Data Sheet and rx has been approved.   [facility] has been notified.

## 2019-09-07 ENCOUNTER — Telehealth: Payer: Self-pay

## 2019-09-07 NOTE — Telephone Encounter (Signed)
Left message for patients cousin Britta Mccreedy to contact the office.

## 2019-09-22 ENCOUNTER — Telehealth: Payer: Self-pay | Admitting: Neurology

## 2019-09-22 NOTE — Telephone Encounter (Signed)
Is this ok?

## 2019-09-22 NOTE — Telephone Encounter (Signed)
Patient is requesting a refill of Seroquel 25mg  be sent to [facility] in [place].

## 2019-09-23 MED ORDER — QUETIAPINE FUMARATE 25 MG PO TABS: ORAL_TABLET | ORAL | 0 refills | Status: DC

## 2019-09-23 NOTE — Telephone Encounter (Signed)
ok 

## 2019-09-23 NOTE — Telephone Encounter (Signed)
Rx(s) sent to pharmacy electronically.  Patient notified and voiced understanding.  

## 2019-10-07 NOTE — Progress Notes (Signed)
Assessment/Plan:   1.  Parkinsons Disease   -Continue carbidopa/levodopa 25/100, 2 tablets at 9 AM/2 tablets at 1 PM/1 tablet at 5 PM/1 tablet at 9 PM  2.  Hallucinations  -Continue quetiapine, 25 mg at bedtime  -Insurance denied Nuplazid.  -congratulated him on decreased Alcohol 3.  Follow up is anticipated in the next 4-6 months, sooner should new neurologic issues arise.   Subjective:   Thomas Huber was seen today in follow up for Parkinsons disease.  My previous records were reviewed prior to todays visit as well as outside records available to me. Pt denies falls.  Pt denies lightheadedness, near syncope.  Last visit, I tried to start him on Nuplazid.  His insurance company denied this, so we instead started him on quetiapine.  I tried to call him multiple times to see how he was doing, as well as his cousin (helps with his caregiving) but nobody ended up returning our phone calls.  No longer having hallucinations and is sleeping much better.  In regards to his alcohol use, he reports that he has not any alcohol in a few weeks  Current prescribed movement disorder medications: Carbidopa/levodopa 25/100, 2 tablets at 9 AM/2 tablets at 1 PM/1 tablet at 5 PM/1 tablet at 9 PM (going to bed after midnight) Quetiapine, 25 mg at bedtime   PREVIOUS MEDICATIONS: none to date  ALLERGIES:   Allergies  Allergen Reactions  . Drug Class [Antihistamines, Diphenhydramine-Type] Other (See Comments)    Benadryl-makes patient incoherent     CURRENT MEDICATIONS:  Outpatient Encounter Medications as of 10/08/2019  Medication Sig  . carbidopa-levodopa (SINEMET IR) 25-100 MG tablet 2 tablets at 9am/2 tablets at 1 pm/1 tablet at 5 pm/1 tablet at 9pm  . ibuprofen (ADVIL,MOTRIN) 200 MG tablet Take 200 mg by mouth as needed for headache.  . QUEtiapine (SEROQUEL) 25 MG tablet TAKE (1) TABLET EVERY NIGHT AT BEDTIME.  . [DISCONTINUED] Pimavanserin Tartrate (NUPLAZID) 34 MG CAPS Take 1 capsule (34 mg  total) by mouth daily. (Patient not taking: Reported on 10/08/2019)   No facility-administered encounter medications on file as of 10/08/2019.    Objective:   PHYSICAL EXAMINATION:    VITALS:   Vitals:   10/08/19 1534  BP: 128/84  Pulse: 86  SpO2: 97%  Weight: 218 lb (98.9 kg)  Height: 5\' 7"  (1.702 m)    GEN:  The patient appears stated age and is in NAD. HEENT:  Normocephalic, atraumatic.  The mucous membranes are moist. The superficial temporal arteries are without ropiness or tenderness. CV:  RRR Lungs:  CTAB Neck/HEME:  There are no carotid bruits bilaterally.  Neurological examination:  Orientation: The patient is alert and oriented x3. Cranial nerves: There is good facial symmetry with facial hypomimia. The speech is fluent and clear. Soft palate rises symmetrically and there is no tongue deviation. Hearing is intact to conversational tone. Sensation: Sensation is intact to light touch throughout Motor: Strength is at least antigravity x4.  Movement examination: Tone: There is mild increased tone in the bilateral upper extremities Abnormal movements: Rare left upper extremity rest tremor Coordination:  There is  decremation with RAM's, with any form of RAMS, including alternating supination and pronation of the forearm, hand opening and closing, finger taps, heel taps and toe taps bilaterally, overall mild Gait and Station: The patient has no difficulty arising out of a deep-seated chair without the use of the hands. The patient's stride length is slightly decreased.  He has a festinating  gait with stooped posture, but is fairly steady.    I have reviewed and interpreted the following labs independently    Chemistry      Component Value Date/Time   NA 138 06/10/2019 1430   K 4.7 06/10/2019 1430   CL 102 06/10/2019 1430   CO2 27 06/10/2019 1430   BUN 10 06/10/2019 1430   CREATININE 0.97 06/10/2019 1430      Component Value Date/Time   CALCIUM 9.5 06/10/2019 1430     ALKPHOS 62 03/18/2019 0858   AST 17 06/10/2019 1430   ALT 9 06/10/2019 1430   BILITOT 0.7 06/10/2019 1430       Lab Results  Component Value Date   WBC 7.2 06/10/2019   HGB 15.2 06/10/2019   HCT 44.1 06/10/2019   MCV 91.3 06/10/2019   PLT 193 06/10/2019    Lab Results  Component Value Date   TSH 2.11 06/10/2019     Total time spent on today's visit was 20 minutes, including both face-to-face time and nonface-to-face time.  Time included that spent on review of records (prior notes available to me/labs/imaging if pertinent), discussing treatment and goals, answering patient's questions and coordinating care.  Cc:  Salley Slaughter, NP

## 2019-10-08 ENCOUNTER — Other Ambulatory Visit: Payer: Self-pay

## 2019-10-08 ENCOUNTER — Ambulatory Visit: Payer: Medicaid Other | Admitting: Neurology

## 2019-10-08 ENCOUNTER — Encounter: Payer: Self-pay | Admitting: Neurology

## 2019-10-08 VITALS — BP 128/84 | HR 86 | Ht 67.0 in | Wt 218.0 lb

## 2019-10-08 DIAGNOSIS — G20 Parkinson's disease: Secondary | ICD-10-CM

## 2019-10-08 MED ORDER — QUETIAPINE FUMARATE 25 MG PO TABS: ORAL_TABLET | ORAL | 1 refills | Status: DC

## 2019-10-08 NOTE — Patient Instructions (Signed)
1.  Continue carbidopa/levodopa 25/100, 2 tablets at 9 AM/2 tablets at 1 PM/1 tablet at 5 PM/1 tablet at 9 PM  2.  Continue seroquel, 25 mg at bed.  I sent that to the pharmacy 3.  Keep away from alcohol.  I am proud of you! 4.  Keep exercising  The physicians and staff at [facility] are committed to providing excellent care. You may receive a survey requesting feedback about your experience at our office. We strive to receive "very good" responses to the survey questions. If you feel that your experience would prevent you from giving the office a "very good " response, please contact our office to try to remedy the situation. We may be reached at [phone]. Thank you for taking the time out of your busy day to complete the survey.

## 2019-11-24 ENCOUNTER — Other Ambulatory Visit: Payer: Self-pay

## 2019-11-24 MED ORDER — CARBIDOPA-LEVODOPA 25-100 MG PO TABS: ORAL_TABLET | ORAL | 1 refills | Status: DC

## 2019-11-24 NOTE — Telephone Encounter (Signed)
Rx(s) sent to pharmacy electronically.  

## 2020-04-12 NOTE — Progress Notes (Deleted)
Assessment/Plan:   1.  Parkinsons Disease  -***Continue carbidopa/levodopa 25/100, 2 tablets at 9 AM/2 tablets at 1 PM/1 tablet at 5 PM/1 tablet at 9 PM  -Discussed importance of exercise.  -We discussed that it used to be thought that levodopa would increase risk of melanoma but now it is believed that Parkinsons itself likely increases risk of melanoma. he is to get regular skin checks. 2.  History of hallucinations  -Insurance denied Nuplazid  -On quetiapine, 25 mg at bed   Subjective:   Thomas Huber was seen today in follow up for Parkinsons disease.  My previous records were reviewed prior to todays visit as well as outside records available to me. Pt denies falls.  Pt denies lightheadedness, near syncope.  No hallucinations with quetiapine.  Mood has been good.  Current prescribed movement disorder medications: ***Carbidopa/levodopa 25/100, 2 tablets at 9 AM/2 tablets at 1 PM/1 tablet at 5 PM/1 tablet at 9 PM (going to bed after midnight) Quetiapine, 25 mg at bedtime   PREVIOUS MEDICATIONS: {Parkinson's RX:18200}  ALLERGIES:   Allergies  Allergen Reactions  . Drug Class [Antihistamines, Diphenhydramine-Type] Other (See Comments)    Benadryl-makes patient incoherent     CURRENT MEDICATIONS:  Outpatient Encounter Medications as of 04/13/2020  Medication Sig  . carbidopa-levodopa (SINEMET IR) 25-100 MG tablet 2 tablets at 9am/2 tablets at 1 pm/1 tablet at 5 pm/1 tablet at 9pm  . ibuprofen (ADVIL,MOTRIN) 200 MG tablet Take 200 mg by mouth as needed for headache.  . QUEtiapine (SEROQUEL) 25 MG tablet TAKE (1) TABLET EVERY NIGHT AT BEDTIME.   No facility-administered encounter medications on file as of 04/13/2020.    Objective:   PHYSICAL EXAMINATION:    VITALS:  There were no vitals filed for this visit.  GEN:  The patient appears stated age and is in NAD. HEENT:  Normocephalic, atraumatic.  The mucous membranes are moist. The superficial temporal arteries are  without ropiness or tenderness. CV:  RRR Lungs:  CTAB Neck/HEME:  There are no carotid bruits bilaterally.  Neurological examination:  Orientation: The patient is alert and oriented x3. Cranial nerves: There is good facial symmetry with*** facial hypomimia. The speech is fluent and clear. Soft palate rises symmetrically and there is no tongue deviation. Hearing is intact to conversational tone. Sensation: Sensation is intact to light touch throughout Motor: Strength is at least antigravity x4.  Movement examination: Tone: There is ***tone in the *** Abnormal movements: *** Coordination:  There is *** decremation with RAM's, *** Gait and Station: The patient has *** difficulty arising out of a deep-seated chair without the use of the hands. The patient's stride length is ***.  The patient has a *** pull test.     I have reviewed and interpreted the following labs independently    Chemistry      Component Value Date/Time   NA 138 06/10/2019 1430   K 4.7 06/10/2019 1430   CL 102 06/10/2019 1430   CO2 27 06/10/2019 1430   BUN 10 06/10/2019 1430   CREATININE 0.97 06/10/2019 1430      Component Value Date/Time   CALCIUM 9.5 06/10/2019 1430   ALKPHOS 62 03/18/2019 0858   AST 17 06/10/2019 1430   ALT 9 06/10/2019 1430   BILITOT 0.7 06/10/2019 1430       Lab Results  Component Value Date   WBC 7.2 06/10/2019   HGB 15.2 06/10/2019   HCT 44.1 06/10/2019   MCV 91.3 06/10/2019   PLT  193 06/10/2019    Lab Results  Component Value Date   TSH 2.11 06/10/2019     Total time spent on today's visit was ***30 minutes, including both face-to-face time and nonface-to-face time.  Time included that spent on review of records (prior notes available to me/labs/imaging if pertinent), discussing treatment and goals, answering patient's questions and coordinating care.  Cc:  Salley Slaughter, NP

## 2020-04-13 ENCOUNTER — Ambulatory Visit: Payer: Medicaid Other | Admitting: Neurology

## 2020-05-18 NOTE — Progress Notes (Signed)
Pt scheduled for VV.  Pt unable to log on.  Will r/s patient for in person visit on monday

## 2020-05-19 ENCOUNTER — Encounter: Payer: Self-pay | Admitting: Neurology

## 2020-05-19 ENCOUNTER — Encounter (INDEPENDENT_AMBULATORY_CARE_PROVIDER_SITE_OTHER): Payer: Medicaid Other | Admitting: Neurology

## 2020-05-19 ENCOUNTER — Other Ambulatory Visit: Payer: Self-pay

## 2020-05-19 DIAGNOSIS — Z008 Encounter for other general examination: Secondary | ICD-10-CM

## 2020-05-19 NOTE — Progress Notes (Signed)
    Assessment/Plan:   1.  Parkinson's disease  -continue carbidopa/levodopa 25/100, 2 tablets at 9 AM/2 tablets at 1 PM/1 tablet at 5 PM/1 tab at 9 PM  2.  Hallucinations  -Patient reports no longer on quetiapine.  We decided to restart at low dosage.  R/B/SE were discussed.  The opportunity to ask questions was given and they were answered to the best of my ability.  The patient expressed understanding and willingness to follow the outlined treatment protocols.  -insurance previously denied Nuplazid  Subjective   Patient seen today in follow-up for Parkinson's disease.   My prior records are reviewed.  No falls.  No lightheadedness or near syncope. He has occasional hallucinations of a boy out the corner of the eye but if he looks right at it, it goes away.  Happens at night when dark.  "its better than it was."  He is having trouble sleeping but admits that he has always had trouble sleeping.  Goes to bed at midnight and doesn't get OOB in the AM until close to 10am.  Current movement d/o meds:  Carbidopa/levodopa 25/100, 2 tablets at 9 AM/2 tablets at 1 PM/1 tablet at 5 PM/1 tablet at 9 PM (going to bed after midnight) Quetiapine, 25 mg at bedtime (pt reports not taking)   Current Outpatient Medications on File Prior to Visit  Medication Sig Dispense Refill  . ibuprofen (ADVIL,MOTRIN) 200 MG tablet Take 200 mg by mouth as needed for headache.     No current facility-administered medications on file prior to visit.     Objective   Vitals:   05/22/20 1020  BP: 122/76  Pulse: 90  SpO2: 98%  Weight: 205 lb (93 kg)  Height: 5\' 8"  (1.727 m)   GEN:  The patient appears stated age and is in NAD. HEENT:  Normocephalic, atraumatic.  The mucous membranes are moist. The superficial temporal arteries are without ropiness or tenderness. CV:  RRR Lungs:  CTAB Neck/HEME:  There are no carotid bruits bilaterally.  Neurological examination:  Orientation: The patient is alert and  oriented x3. Cranial nerves: There is good facial symmetry. The speech is fluent and clear. Soft palate rises symmetrically and there is no tongue deviation. Hearing is intact to conversational tone. Sensation: Sensation is intact to light touch throughout Motor: Strength is 5/5 in the bilateral upper and lower extremities.   Shoulder shrug is equal and symmetric.  There is no pronator drift.  Movement examination: Tone: There is mild to mod increased tone in the RUE Abnormal movements: none Coordination:  There is  decremation with RAM's, with any form of RAMS, including alternating supination and pronation of the forearm, hand opening and closing, finger taps, heel taps and toe taps. Gait and Station: The patient has no difficulty arising out of a deep-seated chair without the use of the hands. The patient's stride length is decreased.      Total time spent on today's visit was 20 minutes, including both face-to-face time and nonface-to-face time.  Time included that spent on review of records (prior notes available to me/labs/imaging if pertinent), discussing treatment and goals, answering patient's questions and coordinating care.   , DO

## 2020-05-22 ENCOUNTER — Encounter: Payer: Self-pay | Admitting: Neurology

## 2020-05-22 ENCOUNTER — Ambulatory Visit: Payer: Medicaid Other | Admitting: Neurology

## 2020-05-22 ENCOUNTER — Other Ambulatory Visit: Payer: Self-pay

## 2020-05-22 VITALS — BP 122/76 | HR 90 | Ht 68.0 in | Wt 205.0 lb

## 2020-05-22 DIAGNOSIS — G20 Parkinson's disease: Secondary | ICD-10-CM

## 2020-05-22 MED ORDER — QUETIAPINE FUMARATE 25 MG PO TABS: ORAL_TABLET | ORAL | 1 refills | Status: DC

## 2020-05-22 MED ORDER — CARBIDOPA-LEVODOPA 25-100 MG PO TABS: ORAL_TABLET | ORAL | 1 refills | Status: DC

## 2020-05-22 NOTE — Patient Instructions (Signed)
1.  continue carbidopa/levodopa 25/100, 2 tablets at 9 AM/2 tablets at 1 PM/1 tablet at 5 PM/1 tab at 9 PM 2.  Restart quetiapine, 25 mg at bedtime (this is for the sleep and the hallucinations)  The physicians and staff at [facility] are committed to providing excellent care. You may receive a survey requesting feedback about your experience at our office. We strive to receive "very good" responses to the survey questions. If you feel that your experience would prevent you from giving the office a "very good " response, please contact our office to try to remedy the situation. We may be reached at [phone]. Thank you for taking the time out of your busy day to complete the survey.

## 2020-11-21 ENCOUNTER — Ambulatory Visit: Payer: Medicaid Other | Admitting: Neurology

## 2020-11-25 ENCOUNTER — Other Ambulatory Visit: Payer: Self-pay | Admitting: Neurology

## 2020-11-25 DIAGNOSIS — G20 Parkinson's disease: Secondary | ICD-10-CM

## 2020-11-28 ENCOUNTER — Other Ambulatory Visit: Payer: Self-pay

## 2021-02-13 NOTE — Progress Notes (Deleted)
    Assessment/Plan:   1.  Parkinson's disease  -continue carbidopa/levodopa 25/100, 2 tablets at 9 AM/2 tablets at 1 PM/1 tablet at 5 PM/1 tab at 9 PM  2.  Hallucinations  -Patient reports no longer on quetiapine.  We decided to restart at low dosage.  R/B/SE were discussed.  The opportunity to ask questions was given and they were answered to the best of my ability.  The patient expressed understanding and willingness to follow the outlined treatment protocols.  -insurance previously denied Nuplazid  Subjective   Patient seen today in follow-up for Parkinson's disease.   My prior records are reviewed.  I have not seen the patient since last February.  He had an appointment in August, but his cousin (who normally brings him) passed away.  Patient states that he has had no falls.  Patient reports that he is able to manage his medications.  Current movement d/o meds:  Carbidopa/levodopa 25/100, 2 tablets at 9 AM/2 tablets at 1 PM/1 tablet at 5 PM/1 tablet at 9 PM (going to bed after midnight) Quetiapine, 25 mg at bedtime (pt reports not taking)   Current Outpatient Medications on File Prior to Visit  Medication Sig Dispense Refill   carbidopa-levodopa (SINEMET IR) 25-100 MG tablet TAKE 2 TABLETS AT 9AM, 2 TABS AT 1PM, 1 TAB AT 5PM AND 1 TAB AT 9PM. 180 tablet 0   ibuprofen (ADVIL,MOTRIN) 200 MG tablet Take 200 mg by mouth as needed for headache.     QUEtiapine (SEROQUEL) 25 MG tablet TAKE (1) TABLET EVERY NIGHT AT BEDTIME. 90 tablet 1   No current facility-administered medications on file prior to visit.     Objective   There were no vitals filed for this visit.  GEN:  The patient appears stated age and is in NAD. HEENT:  Normocephalic, atraumatic.  The mucous membranes are moist. The superficial temporal arteries are without ropiness or tenderness. CV:  RRR Lungs:  CTAB Neck/HEME:  There are no carotid bruits bilaterally.  Neurological examination:  Orientation: The patient  is alert and oriented x3. Cranial nerves: There is good facial symmetry. The speech is fluent and clear. Soft palate rises symmetrically and there is no tongue deviation. Hearing is intact to conversational tone. Sensation: Sensation is intact to light touch throughout Motor: Strength is 5/5 in the bilateral upper and lower extremities.   Shoulder shrug is equal and symmetric.  There is no pronator drift.  Movement examination: Tone: There is mild to mod increased tone in the RUE Abnormal movements: none Coordination:  There is  decremation with RAM's, with any form of RAMS, including alternating supination and pronation of the forearm, hand opening and closing, finger taps, heel taps and toe taps. Gait and Station: The patient has no difficulty arising out of a deep-seated chair without the use of the hands. The patient's stride length is decreased.      Total time spent on today's visit was *** minutes, including both face-to-face time and nonface-to-face time.  Time included that spent on review of records (prior notes available to me/labs/imaging if pertinent), discussing treatment and goals, answering patient's questions and coordinating care.   Kerin Salen, DO

## 2021-02-20 ENCOUNTER — Ambulatory Visit: Payer: Medicaid Other | Admitting: Neurology

## 2021-02-20 NOTE — Progress Notes (Signed)
    Assessment/Plan:   1.  Parkinson's disease  -slightly increase carbidopa/levodopa 25/100, 3 tablets at 9 AM/2 tablets at 1 PM/1 tablet at 5 PM/1 tab at 9 PM  -discussed timing of med  2.  Hallucinations  -pt on quetiapine - 25 mg nightly.  -insurance previously denied Nuplazid  3.  Insomnia  - I think that he is getting day/night reversal.  Discussed going to bed by 11pm and up for day by 9am and then no naps after 2pm  -can add melatonin, 3 mg at bed  Subjective   Patient seen today in follow-up for Parkinson's disease.   My prior records are reviewed.  I have not seen the patient since last February.  He had an appointment in August, but his cousin (who normally brings him) passed away.  Pts cousin in law supplements the history (present today).  Patient states that he has had no falls.  Patient reports that he is able to manage his medications.  Pt having trouble managing sleep.  He does well for first 3 hours with seroquel but then awakens much of night.  Pt has quit drinking alcohol!  Current movement d/o meds:  Carbidopa/levodopa 25/100, 2 tablets at 9 AM/2 tablets at 1 PM/1 tablet at 5 PM/1 tablet at 9 PM (going to bed after midnight) Quetiapine, 25 mg at bedtime    Current Outpatient Medications on File Prior to Visit  Medication Sig Dispense Refill   ibuprofen (ADVIL,MOTRIN) 200 MG tablet Take 200 mg by mouth as needed for headache.     QUEtiapine (SEROQUEL) 25 MG tablet TAKE (1) TABLET EVERY NIGHT AT BEDTIME. 90 tablet 1   No current facility-administered medications on file prior to visit.     Objective   Vitals:   02/27/21 1257  BP: 123/79  Pulse: 81  SpO2: 98%  Weight: 195 lb 9.6 oz (88.7 kg)    GEN:  The patient appears stated age and is in NAD. HEENT:  Normocephalic, atraumatic.  The mucous membranes are moist. The superficial temporal arteries are without ropiness or tenderness. CV:  RRR Lungs:  CTAB Neck/HEME:  There are no carotid bruits  bilaterally.  Neurological examination:  Orientation: The patient is alert and oriented x3. Cranial nerves: There is good facial symmetry. The speech is fluent and clear. Soft palate rises symmetrically and there is no tongue deviation. Hearing is intact to conversational tone. Sensation: Sensation is intact to light touch throughout Motor: Strength is 5/5 in the bilateral upper and lower extremities.   Shoulder shrug is equal and symmetric.  There is no pronator drift.  Movement examination: Tone: There is mod increased tone in the RUE Abnormal movements: none Coordination:  There is  decremation with RAM's, with any form of RAMS, including alternating supination and pronation of the forearm, hand opening and closing, finger taps, heel taps and toe taps. Gait and Station: The patient has no difficulty arising out of a deep-seated chair without the use of the hands. The patient's stride length is decreased with decreased arm swing and drags the L leg.  He is shuffling.    Total time spent on today's visit was 30 minutes, including both face-to-face time and nonface-to-face time.  Time included that spent on review of records (prior notes available to me/labs/imaging if pertinent), discussing treatment and goals, answering patient's questions and coordinating care.   Kerin Salen, DO

## 2021-02-27 ENCOUNTER — Ambulatory Visit: Payer: Medicare Other | Admitting: Neurology

## 2021-02-27 ENCOUNTER — Encounter: Payer: Self-pay | Admitting: Neurology

## 2021-02-27 ENCOUNTER — Other Ambulatory Visit: Payer: Self-pay

## 2021-02-27 VITALS — BP 123/79 | HR 81 | Wt 195.6 lb

## 2021-02-27 DIAGNOSIS — G20 Parkinson's disease: Secondary | ICD-10-CM | POA: Diagnosis not present

## 2021-02-27 DIAGNOSIS — G4700 Insomnia, unspecified: Secondary | ICD-10-CM | POA: Diagnosis not present

## 2021-02-27 DIAGNOSIS — R441 Visual hallucinations: Secondary | ICD-10-CM | POA: Diagnosis not present

## 2021-02-27 MED ORDER — CARBIDOPA-LEVODOPA 25-100 MG PO TABS: ORAL_TABLET | ORAL | 1 refills | Status: DC

## 2021-02-27 NOTE — Patient Instructions (Addendum)
Take carbidopa/levodopa 25/100, 3 tablets at 9 AM/2 tablets at 1 PM/1 tablet at 5 PM/1 tab at 9 PM Take quetiapine, 25 mg at nightly ADD melatonin, 3 mg at bed Stay ACTIVE in day (no napping after 2pm) In bed by 11pm  The physicians and staff at [facility] are committed to providing excellent care. You may receive a survey requesting feedback about your experience at our office. We strive to receive "very good" responses to the survey questions. If you feel that your experience would prevent you from giving the office a "very good " response, please contact our office to try to remedy the situation. We may be reached at [phone]. Thank you for taking the time out of your busy day to complete the survey.

## 2021-03-01 IMAGING — CT CT HEAD W/O CM
3 series · 16 of 47 positions shown, 19 images · non-contrast
Comparison: None.

CLINICAL DATA: Altered mental status. Encephalopathy.

EXAM:
CT HEAD WITHOUT CONTRAST
TECHNIQUE: Contiguous axial images were obtained from the base of the skull
through the vertex without intravenous contrast.

[Series 3: head w o · axial · 0.45mm/px · z∈[+36,+186]mm · 10 of 36 slices shown, 13 images]
[im 3/36  brain]
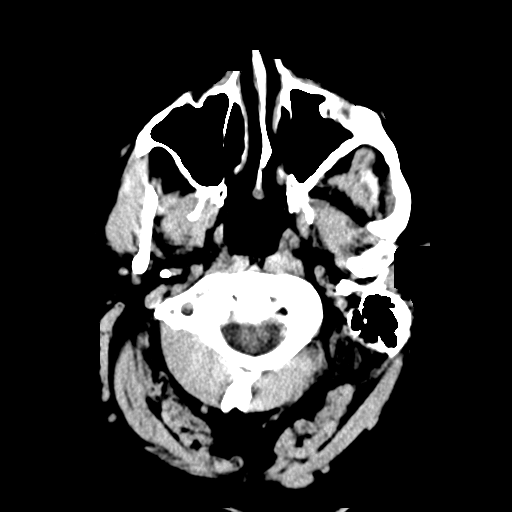
[im 3/36  bone]
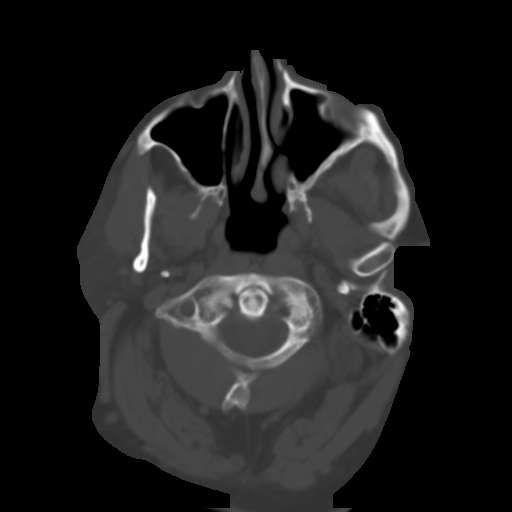
[im 7/36  brain]
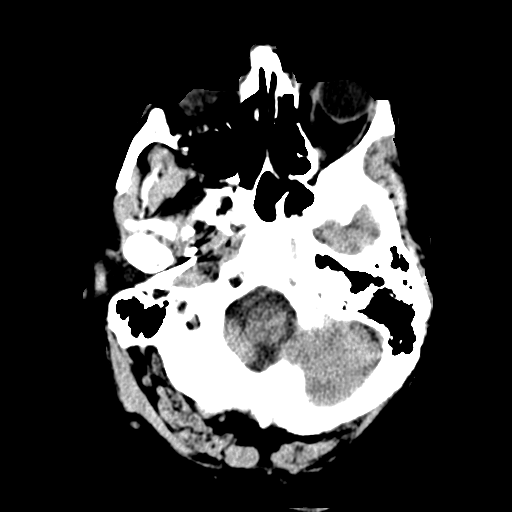
[im 10/36  brain]
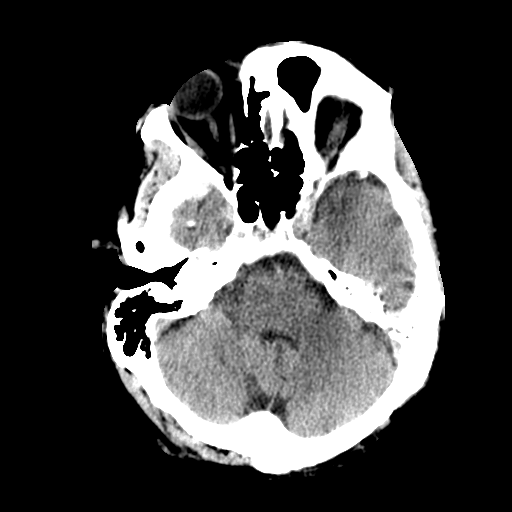
[im 13/36  brain]
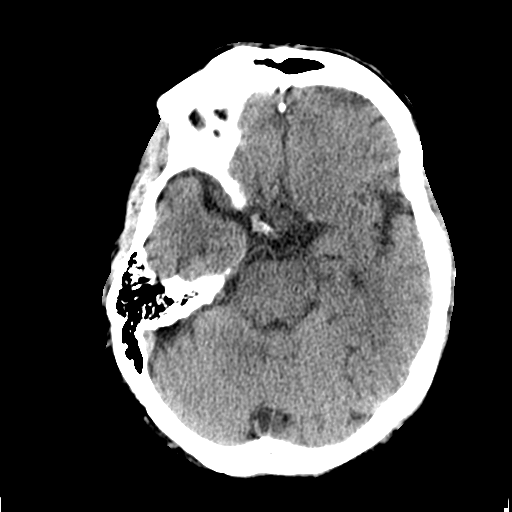
[im 16/36  brain]
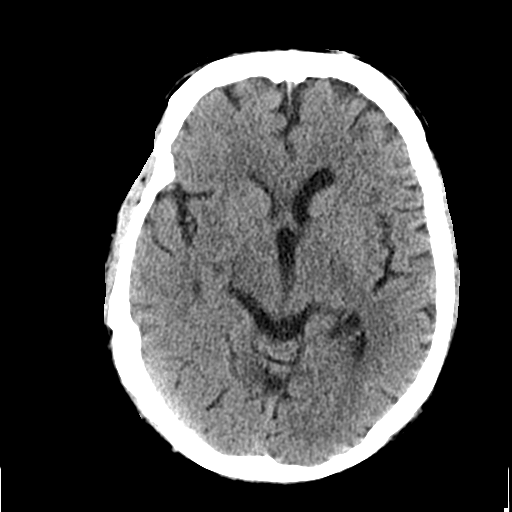
[im 16/36  bone]
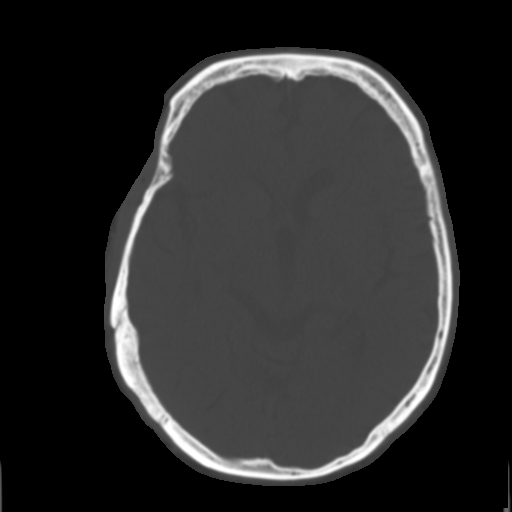
[im 20/36  brain]
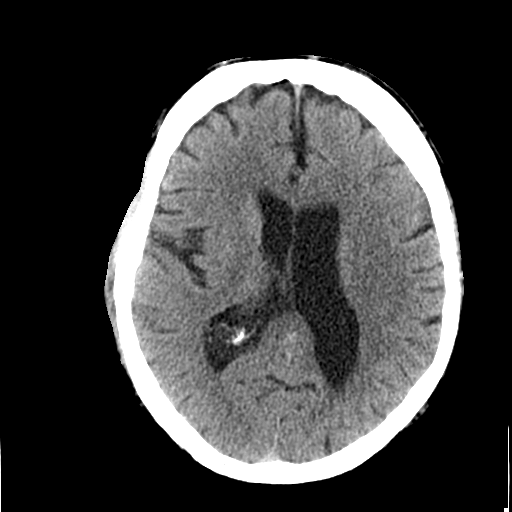
[im 23/36  brain]
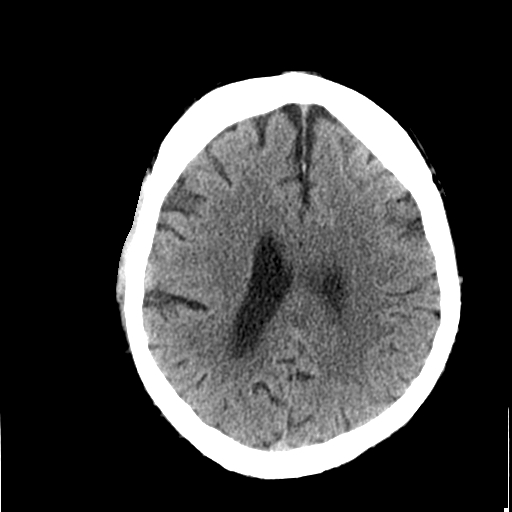
[im 27/36  brain]
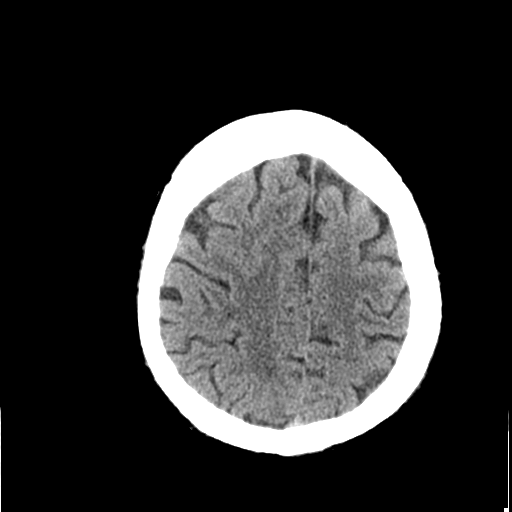
[im 29/36  brain]
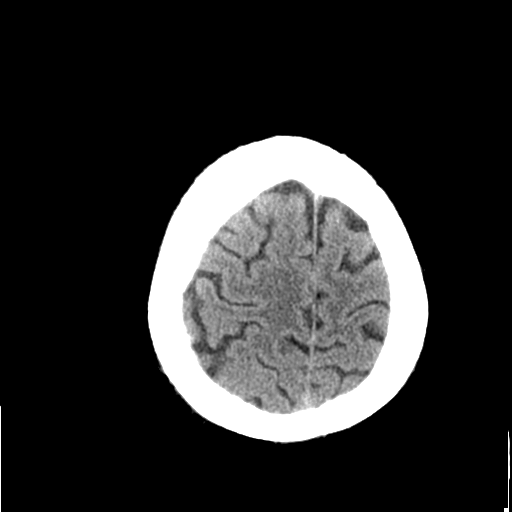
[im 29/36  bone]
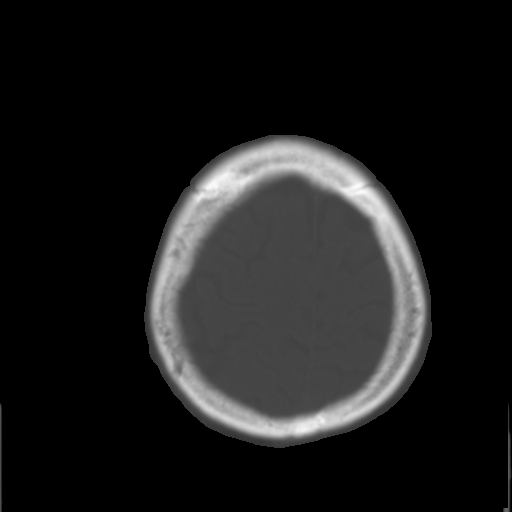
[im 33/36  brain]
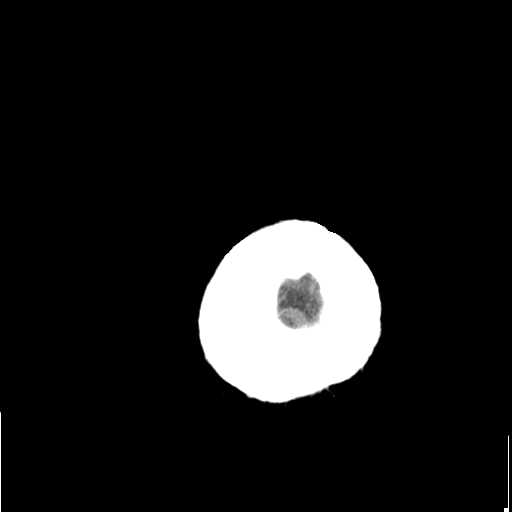

[Series 5: coronal soft · coronal · 0.37mm/px · 3 of 83 slices shown]
[im 28/83  brain]
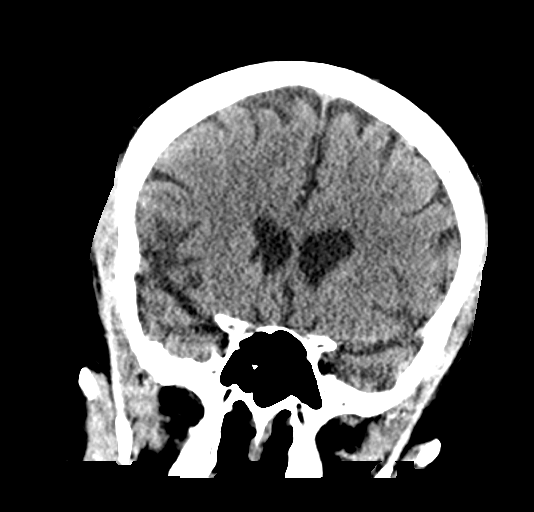
[im 37/83  brain]
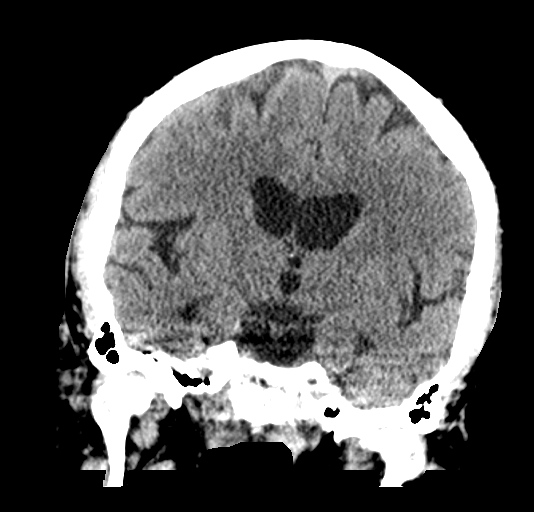
[im 46/83  brain]
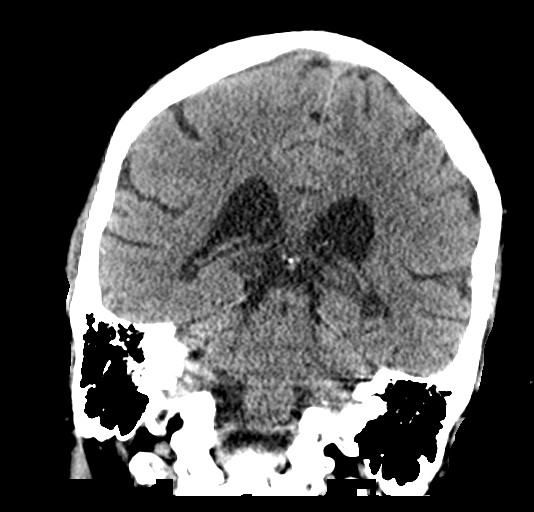

[Series 6: sagittal soft · sagittal · 0.38mm/px · 3 of 68 slices shown]
[im 26/68  brain]
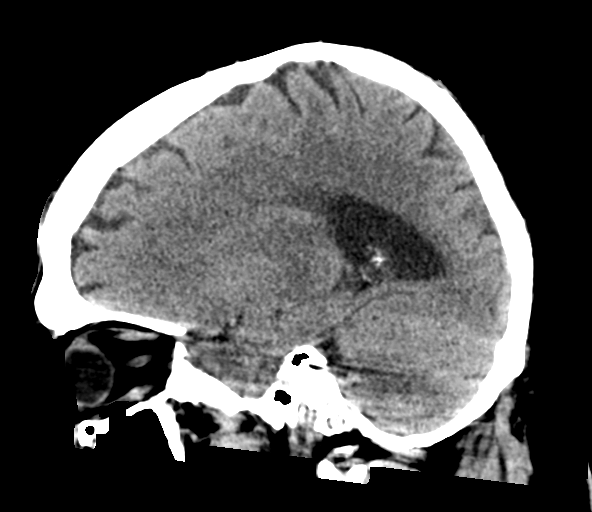
[im 34/68  brain]
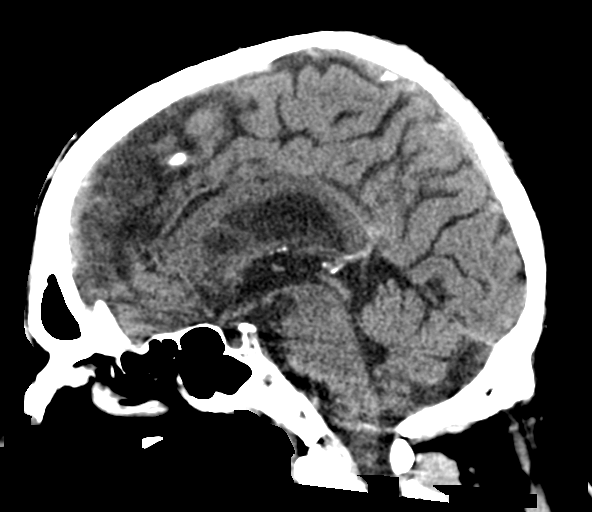
[im 43/68  brain]
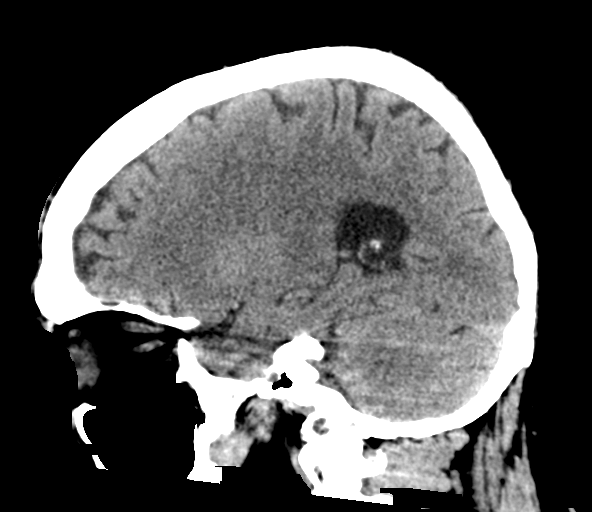

[16 of 47 positions shown; findings below may reference images not displayed]

FINDINGS: Brain: Mild-to-moderate enlargement of the ventricles and
subarachnoid spaces. No intracranial hemorrhage, mass lesion or CT
evidence of acute infarction.

Vascular: No hyperdense vessel or unexpected calcification.

Skull: Normal. Negative for fracture or focal lesion.

Sinuses/Orbits: Mild left maxillary sinus wall thickening and
surgical absence of a portion of the medial wall of the left
maxillary sinus, compatible with previous changes of chronic
sinusitis and subsequent surgery. Unremarkable orbits.

Other: None.
IMPRESSION: 1. No acute abnormality.
2. Mild to moderate diffuse cerebral and cerebellar atrophy.

## 2021-06-22 NOTE — Progress Notes (Deleted)
? ? ?  Assessment/Plan:  ? ?1.  Parkinson's disease ? -Continue carbidopa/levodopa 25/100, 3 tablets at 9 AM/2 tablets at 1 PM/1 tablet at 5 PM/1 tab at 9 PM ? -discussed timing of med ? ?2.  Hallucinations ? -pt on quetiapine - 25 mg nightly. ? -insurance previously denied Nuplazid ? ?3.  Insomnia ? - I think that he is getting day/night reversal.  Discussed going to bed by 11pm and up for day by 9am and then no naps after 2pm ? -can add melatonin, 3 mg at bed ? ?Subjective  ? ?Patient seen today in follow-up for Parkinson's disease.   My prior records are reviewed.  We slightly increased his levodopa last visit.  We talked about timing of medications and day night reversal. ? ?Current movement d/o meds: ? ?carbidopa/levodopa 25/100, 3 tablets at 9 AM/2 tablets at 1 PM/1 tablet at 5 PM/1 tab at 9 PM(a slight increase; going to bed after midnight) ?Quetiapine, 25 mg at bedtime  ?  ?Current Outpatient Medications on File Prior to Visit  ?Medication Sig Dispense Refill  ? carbidopa-levodopa (SINEMET IR) 25-100 MG tablet TAKE 3 TABLETS AT 9AM, 2 TABS AT 1PM, 1 TAB AT 5PM AND 1 TAB AT 9PM. 630 tablet 1  ? ibuprofen (ADVIL,MOTRIN) 200 MG tablet Take 200 mg by mouth as needed for headache.    ? QUEtiapine (SEROQUEL) 25 MG tablet TAKE (1) TABLET EVERY NIGHT AT BEDTIME. 90 tablet 1  ? ?No current facility-administered medications on file prior to visit.  ? ? ? ?Objective  ? ?There were no vitals filed for this visit. ? ? ?GEN:  The patient appears stated age and is in NAD. ?HEENT:  Normocephalic, atraumatic.  The mucous membranes are moist. The superficial temporal arteries are without ropiness or tenderness. ?CV:  RRR ?Lungs:  CTAB ?Neck/HEME:  There are no carotid bruits bilaterally. ? ?Neurological examination: ? ?Orientation: The patient is alert and oriented x3. ?Cranial nerves: There is good facial symmetry. The speech is fluent and clear. Soft palate rises symmetrically and there is no tongue deviation. Hearing is  intact to conversational tone. ?Sensation: Sensation is intact to light touch throughout ?Motor: Strength is 5/5 in the bilateral upper and lower extremities.   Shoulder shrug is equal and symmetric.  There is no pronator drift. ? ?Movement examination: ?Tone: There is mod increased tone in the RUE ?Abnormal movements: none ?Coordination:  There is  decremation with RAM's, with any form of RAMS, including alternating supination and pronation of the forearm, hand opening and closing, finger taps, heel taps and toe taps. ?Gait and Station: The patient has no difficulty arising out of a deep-seated chair without the use of the hands. The patient's stride length is decreased with decreased arm swing and drags the L leg.  He is shuffling. ? ? ? ?Total time spent on today's visit was *** minutes, including both face-to-face time and nonface-to-face time.  Time included that spent on review of records (prior notes available to me/labs/imaging if pertinent), discussing treatment and goals, answering patient's questions and coordinating care. ? ? ?Kerin Salen, DO ? ?

## 2021-06-25 ENCOUNTER — Encounter: Payer: Self-pay | Admitting: Neurology

## 2021-06-25 ENCOUNTER — Ambulatory Visit: Payer: Medicaid Other | Admitting: Neurology

## 2021-06-25 DIAGNOSIS — Z029 Encounter for administrative examinations, unspecified: Secondary | ICD-10-CM

## 2021-08-10 NOTE — Progress Notes (Signed)
Assessment/Plan:   1.  Parkinson's disease             -Continue carbidopa/levodopa 25/100, 3 tablets at 9 AM/2 tablets at 1 PM/1 tablet at 5 PM/1 tab at 9 PM             -discussed timing of med  -We discussed that it used to be thought that levodopa would increase risk of melanoma but now it is believed that Parkinsons itself likely increases risk of melanoma. he is to get regular skin checks.    2.  Hallucinations             -restart the quetiapine, 25 mg at bed.               -insurance previously denied Nuplazid   3.  Insomnia             - restart quetiapine  -add melatonin, 3-5 mg q hs  4.  Discussed that he needs regular care with pcp  Subjective   Patient seen today in follow-up for Parkinson's disease.   My prior records are reviewed. Pt with cousin in law who supplements hx.  He is supposed to be on 3 tabs of levodopa in the AM but he is only taking 2.  He also stopped his seroquel and is c/o inability to sleep and is c/o some hallucinations of mice in evening/night.  He didn't take the melatonin.     We talked about timing of medications and day night reversal last visit.  He reports he is doing better in this regard.   He goes to bed by midnight and up by 9am.  Isn't following regular with pcp.   Current movement d/o meds:   carbidopa/levodopa 25/100, 3 tablets at 9 AM/2 tablets at 1 PM/1 tablet at 5 PM/1 tab at 9 PM Quetiapine, 25 mg at bedtime    Current Outpatient Medications on File Prior to Visit  Medication Sig Dispense Refill   carbidopa-levodopa (SINEMET IR) 25-100 MG tablet TAKE 3 TABLETS AT 9AM, 2 TABS AT 1PM, 1 TAB AT 5PM AND 1 TAB AT 9PM. (Patient taking differently: TAKE 32TABLETS AT 9AM, 2 TABS AT 1PM, 1 TAB AT 5PM AND 1 TAB AT 9PM.) 630 tablet 1   ibuprofen (ADVIL,MOTRIN) 200 MG tablet Take 200 mg by mouth as needed for headache.     QUEtiapine (SEROQUEL) 25 MG tablet TAKE (1) TABLET EVERY NIGHT AT BEDTIME. (Patient not taking: Reported on 08/14/2021)  90 tablet 1   No current facility-administered medications on file prior to visit.     Objective   Vitals:   08/14/21 0829  BP: 132/78  Pulse: 67  SpO2: 99%  Weight: 193 lb (87.5 kg)  Height: 5\' 8"  (1.727 m)     GEN:  The patient appears stated age and is in NAD. HEENT:  Normocephalic, atraumatic.  The mucous membranes are moist. The superficial temporal arteries are without ropiness or tenderness. CV:  RRR Lungs:  CTAB Neck/HEME:  There are no carotid bruits bilaterally.  Neurological examination:  Orientation: The patient is alert and oriented x3. Cranial nerves: There is good facial symmetry. The speech is fluent and clear. Soft palate rises symmetrically and there is no tongue deviation. Hearing is intact to conversational tone. Sensation: Sensation is intact to light touch throughout Motor: Strength is 5/5 in the bilateral upper and lower extremities.   Shoulder shrug is equal and symmetric.  There is no pronator drift.  Movement examination:  Tone: There is mod increased tone in the RUE and mild to mod in the LUE (he has not taken any Parkinsons Disease meds today) Abnormal movements: none Coordination:  There is  decremation with RAM's, with any form of RAMS, including alternating supination and pronation of the forearm, hand opening and closing, finger taps, heel taps and toe taps. Gait and Station: The patient has no difficulty arising out of a deep-seated chair without the use of the hands. The patient's stride length is decreased with decreased arm swing and drags the L leg.  He is shuffling.  He festinates.        Alonza Bogus, DO

## 2021-08-14 ENCOUNTER — Encounter: Payer: Self-pay | Admitting: Neurology

## 2021-08-14 ENCOUNTER — Ambulatory Visit (INDEPENDENT_AMBULATORY_CARE_PROVIDER_SITE_OTHER): Payer: Medicare Other | Admitting: Neurology

## 2021-08-14 DIAGNOSIS — G20 Parkinson's disease: Secondary | ICD-10-CM | POA: Diagnosis not present

## 2021-08-14 DIAGNOSIS — G20A1 Parkinson's disease without dyskinesia, without mention of fluctuations: Secondary | ICD-10-CM

## 2021-08-14 MED ORDER — QUETIAPINE FUMARATE 25 MG PO TABS: ORAL_TABLET | ORAL | 1 refills | Status: DC

## 2021-08-14 MED ORDER — CARBIDOPA-LEVODOPA 25-100 MG PO TABS: ORAL_TABLET | ORAL | 1 refills | Status: AC

## 2021-08-14 NOTE — Patient Instructions (Addendum)
Restart quetiapine 25 mg at bed Take carbidopa/levodopa 25/100, 3 tablets at 9 AM/2 tablets at 1 PM/1 tablet at 5 PM/1 tab at 9 PM Make an appt with your PCP  As we discussed, it used to be thought that levodopa would increase risk of melanoma but now it is believed that Parkinsons itself likely increases risk of melanoma. I recommend yearly skin checks with a board certified dermatologist.  You can call [facility] or [facility] for an appointment.  [facility] Address: [address] [place], [place], [place] [address] Phone: [phone]  [facility] [address] [place], [place], [place] Phone: [phone]

## 2022-02-11 NOTE — Progress Notes (Signed)
Assessment/Plan:   1.  Parkinson's disease             -Continue carbidopa/levodopa 25/100, 3 tablets at 9 AM/2 tablets at 1 PM/1 tablet at 5 PM/1 tab at 9 PM.  He's a bit underdosed but functioning well so worry about changing it since he lives on own.  We decided to hold on changes.  -We discussed that it used to be thought that levodopa would increase risk of melanoma but now it is believed that Parkinsons itself likely increases risk of melanoma. he is to get regular skin checks.  2.  Insomnia             - he hasn't had hallucinations for a long time and he has consistently stopped the quetiapine so we will leave him off of it  3.  Discussed that he needs regular care with pcp.  Transportation is an issue and lack of involvement of family (they bring him here)  Subjective   Patient seen today in follow-up for Parkinson's disease.   My prior records are reviewed. No one accompanies the patient today.  Last visit,  he was supposed to be taking 3 tablets of levodopa at his 9 AM dose and he was only taking 2 a day told him to restart the 3 tablets.  He reports that he is taking the 3 carbidopa/levodopa in the AM but isn't taking the quetiapine.   We also restarted his quetiapine.   He isn't taking that.  No hallucinations.  No alcohol - "I feel better!."  He is doing exercises with a tv program every day.  He hasn't seen a pcp yet.  States that his mom died over a year ago and she was taking care of that.  No falls   Current movement d/o meds:   carbidopa/levodopa 25/100, 3 tablets at 9 AM/2 tablets at 1 PM/1 tablet at 5 PM/1 tab at 9 PM Quetiapine, 25 mg at bedtime (restarted last visit)   Current Outpatient Medications on File Prior to Visit  Medication Sig Dispense Refill   carbidopa-levodopa (SINEMET IR) 25-100 MG tablet TAKE 3 TABLETS AT 9AM, 2 TABS AT 1PM, 1 TAB AT 5PM AND 1 TAB AT 9PM. 630 tablet 1   ibuprofen (ADVIL,MOTRIN) 200 MG tablet Take 200 mg by mouth as needed for  headache.     No current facility-administered medications on file prior to visit.     Objective   Vitals:   02/19/22 0838  BP: 118/72  Pulse: (!) 59  SpO2: 99%  Weight: 186 lb 3.2 oz (84.5 kg)  Height: 5\' 7"  (1.702 m)      GEN:  The patient appears stated age and is in NAD. HEENT:  Normocephalic, atraumatic.  The mucous membranes are moist. The superficial temporal arteries are without ropiness or tenderness.   Neurological examination:  Orientation: The patient is alert and oriented x3. Cranial nerves: There is good facial symmetry. The speech is fluent and clear. Soft palate rises symmetrically and there is no tongue deviation. Hearing is intact to conversational tone. Sensation: Sensation is intact to light touch throughout Motor: Strength is at least antigravity x 4.  Movement examination: Tone: There is mod increased tone in the RUE and mild to mod in the LUE (stable) Abnormal movements: none Coordination:  There is  decremation with RAM's, with any form of RAMS, including alternating supination and pronation of the forearm, hand opening and closing, finger taps, heel taps and toe taps. Gait  and Station: The patient has no difficulty arising out of a deep-seated chair without the use of the hands. The patient's stride length is decreased with decreased arm swing and drags the L leg.  He is shuffling a bit.        Kerin Salen, DO

## 2022-02-19 ENCOUNTER — Ambulatory Visit (INDEPENDENT_AMBULATORY_CARE_PROVIDER_SITE_OTHER): Payer: Medicare Other | Admitting: Neurology

## 2022-02-19 ENCOUNTER — Encounter: Payer: Self-pay | Admitting: Neurology

## 2022-02-19 VITALS — BP 118/72 | HR 59 | Ht 67.0 in | Wt 186.2 lb

## 2022-02-19 DIAGNOSIS — G20A1 Parkinson's disease without dyskinesia, without mention of fluctuations: Secondary | ICD-10-CM | POA: Diagnosis not present

## 2022-02-19 NOTE — Patient Instructions (Signed)
Make an appointment with primary care doctor  Local and Online Resources for Power over Parkinson's Group  November 2023    LOCAL [place] PARKINSON'S GROUPS   Power over Parkinson's Group:    Power Over Parkinson's Patient Education Group will be Wednesday, November 8th-*Hybrid meting*- in person at [facility] location and via WEBEX, 2:00-3:00 pm.   Starting in November, Power over Parkinson's and Care Partner Groups will meet together, with plans for separate break out session for caregivers (*this will be evolving over the next few months) Upcoming Power over Parkinson's Meetings/Care Partner Support:  2nd Wednesdays of the month at 2 pm:   November 8th, December 13th  Contact Amy Marriott at [email or web address] if interested in participating in this group    LOCAL EVENTS AND NEW OFFERINGS  BALANCE and Fall Prevention Workshop.  Thursday, November 9th 1-2pm, [address] [place], [employer].  Register with Christy Weaver at [email or web address] or [phone] New PWR! Moves Community Fitness Instructor-Led Classes offering at [employer]!  TUESDAYS and Wednesdays 1-2 pm.   Contact Christy Weaver at  [email or web address]  or [phone] (Tuesday classes are modified for chair and standing only) Dance for Parkinson 's classes will be on Tuesdays 9:30am-10:30am starting October 3-December 12 with a break the week of November 21st. Located in the [facility], in the first floor of the [facility] ([address] To register:  [email or web address] or [phone]  Drumming for Parkinson's will be held on 2nd and 4th Mondays at 11:00 am.   Located at the [employer] ([address] [place].)  Contact Jane Maydian at [email or web address] or [phone]  Through support from the Parkinson's Foundation, the Dance and Drumming for Parkinson's classes are free for both patients and  caregivers.    [employer] Parkinson's Tai Chi Class, Mondays at 11 am.  Call [phone] for details Parkinson's Holiday Party.  Wednesday, December 6th, 4:00-5:00 pm.  [employer].  RSVP to Karen Simmers at [phone] or [email or web address]   ONLINE EDUCATION AND SUPPORT  Parkinson Foundation:  www.parkinson.org  PD Health at Home continues:  Mindfulness Mondays, Wellness Wednesdays, Fitness Fridays   Upcoming Education:   Why Should you Participate in Parkinson's Research?  Wednesday, Nov. 29th,  1-2 pm  Expert Briefing:    Hallucinations and Delusions in Parkinson's.  Wednesday, Nov. 8th, 1-2 pm  Register for expert briefings (webinars) at [email or web address].org/resources-support/online-education/expert-[email or web address]-webinars  Please check out their website to sign up for emails and see their full online offerings      Michael J Fox Foundation:  www.michaeljfox.org   Third Thursday Webinars:  On the third Thursday of every month at 12 p.m. ET, join our free live webinars to learn about various aspects of living with Parkinson's disease and our work to speed medical breakthroughs.  Upcoming Webinar:  A Year Like No Other in Parkinson's Research:  2023 in Review.  Thursday, November 16th 12 noon. Check out additional information on their website to see their full online offerings    Davis Phinney Foundation:  www.davisphinneyfoundation.org  Upcoming Webinar:   Stay tuned  Webinar Series:  Living with Parkinson's Meetup.   Third Thursdays each month, 3 pm  Care Partner Monthly Meetup.  With Connie Carpenter Phinney.  First Tuesday of each month, 2 pm  Check out additional information to Live Well Today on their website    Parkinson and Movement Disorders (PMD) Alliance:  www.pmdalliance.org  [employer]:  Online Education Events  Sign up   for emails, which are sent weekly to give you updates on programming and online offerings    Parkinson's Association of the  [place]:  www.parkinsonassociation.org  Information on online support groups, education events, and online exercises including Yoga, Parkinson's exercises and more-LOTS of information on links to PD resources and online events  Virtual Support Group through Parkinson's Association of the [place]; next one is scheduled for Wednesday, November 1st  at 2 pm.  (These are typically scheduled for the 1st Wednesday of the month at 2 pm).  Visit website for details.   MOVEMENT AND EXERCISE OPPORTUNITIES  PWR! Moves Classes at [place].  Wednesdays 10 and 11 am.   Contact Amy Marriott, PT [email or web address] if interested.  NEW PWR! Moves Class offerings at [employer].  *TUESDAYS* and Wednesdays 1-2 pm.  Contact Vonna Kotyk at  [email or web address]    Parkinson's Wellness Recovery (PWR! Moves)  www.pwr4life.org  Info on the PWR! Virtual Experience:  You will have access to our expertise?through self-assessment, guided plans that start with the PD-specific fundamentals, educational content, tips, Q&A with an expert, and a growing Art therapist of PD-specific pre-recorded and live exercise classes of varying types and intensity - both physical and cognitive! If that is not enough, we offer 1:1 wellness consultations (in-person or virtual) to personalize your PWR! Research scientist (medical).   [place] Fridays:   As part of the PD Health @ Home program, this free video series focuses each week on one aspect of fitness designed to support people living with Parkinson's.? These weekly videos highlight the [place] fitness guidelines for people with Parkinson's disease.  ModemGamers.[email or web address]   Dance for PD website is offering free, live-stream classes throughout the week, as well as links to [employer] of classes:  https://danceforparkinsons.org/  Virtual dance and Pilates for Parkinson's classes: Click  on the Community Tab> Parkinson's Movement Initiative Tab.  To register for classes and for more information, visit [email or web address] and click the "community" tab.   [employer] Parkinson's Cycling Classes   [employer]:  Thursdays @ Noon-Live classes at [employer] ([employer] [facility] [email or web address]?or [phone])  [facility]: Virtual Classes Mondays and Thursdays Jeanette Caprice classes Tuesday, Wednesday and Thursday (contact [place] at [email or web address] ?or [phone])  [place]  Varied levels of classes are offered Tuesdays and Thursdays at [employer].   Stretching with Verdis Frederickson weekly class is also offered for people with Parkinson's  To observe a class or for more information, call [phone] or email Hezzie Bump at [email or web address]   ADDITIONAL SUPPORT AND RESOURCES  Well[employer]Spring [employer]:Online Caregiver Education Opportunities:  www.well-springsolutions.org/caregiver-education/caregiver-support-group.  You may also contact Vickki Muff at [email or web address] or [phone].     Well-Spring Navigator:  Just1Navigator program, a?free service to help individuals and families through the journey of determining care for older adults.  The "Navigator" is a Education officer, museum, Arnell Asal, who will speak with a prospective client and/or loved ones to provide an assessment of the situation and a set of recommendations for a personalized care plan -- all free of charge, and whether?Well[employer]Spring [employer] offers the needed service or not. If the need is not a service we provide, we are well-connected with reputable programs in town that we can refer you to.  www.well-springsolutions.org or to speak with the Navigator, call [phone].

## 2022-03-10 ENCOUNTER — Other Ambulatory Visit: Payer: Self-pay

## 2022-03-10 ENCOUNTER — Inpatient Hospital Stay (HOSPITAL_COMMUNITY)
Admission: EM | Admit: 2022-03-10 | Discharge: 2022-04-25 | DRG: 064 | Disposition: E | Payer: Medicare Other | Attending: Family Medicine | Admitting: Family Medicine

## 2022-03-10 ENCOUNTER — Emergency Department (HOSPITAL_COMMUNITY): Payer: Medicare Other

## 2022-03-10 ENCOUNTER — Encounter (HOSPITAL_COMMUNITY): Payer: Self-pay

## 2022-03-10 DIAGNOSIS — I63511 Cerebral infarction due to unspecified occlusion or stenosis of right middle cerebral artery: Principal | ICD-10-CM | POA: Diagnosis present

## 2022-03-10 DIAGNOSIS — R29720 NIHSS score 20: Secondary | ICD-10-CM | POA: Diagnosis present

## 2022-03-10 DIAGNOSIS — E785 Hyperlipidemia, unspecified: Secondary | ICD-10-CM | POA: Diagnosis present

## 2022-03-10 DIAGNOSIS — Z597 Insufficient social insurance and welfare support: Secondary | ICD-10-CM

## 2022-03-10 DIAGNOSIS — E538 Deficiency of other specified B group vitamins: Secondary | ICD-10-CM | POA: Diagnosis present

## 2022-03-10 DIAGNOSIS — G459 Transient cerebral ischemic attack, unspecified: Secondary | ICD-10-CM | POA: Diagnosis not present

## 2022-03-10 DIAGNOSIS — R1310 Dysphagia, unspecified: Secondary | ICD-10-CM | POA: Diagnosis present

## 2022-03-10 DIAGNOSIS — I639 Cerebral infarction, unspecified: Secondary | ICD-10-CM | POA: Diagnosis present

## 2022-03-10 DIAGNOSIS — G9341 Metabolic encephalopathy: Secondary | ICD-10-CM | POA: Insufficient documentation

## 2022-03-10 DIAGNOSIS — G928 Other toxic encephalopathy: Secondary | ICD-10-CM | POA: Diagnosis present

## 2022-03-10 DIAGNOSIS — F1021 Alcohol dependence, in remission: Secondary | ICD-10-CM | POA: Diagnosis present

## 2022-03-10 DIAGNOSIS — R4701 Aphasia: Secondary | ICD-10-CM | POA: Diagnosis present

## 2022-03-10 DIAGNOSIS — L8990 Pressure ulcer of unspecified site, unspecified stage: Secondary | ICD-10-CM | POA: Insufficient documentation

## 2022-03-10 DIAGNOSIS — F0280 Dementia in other diseases classified elsewhere without behavioral disturbance: Secondary | ICD-10-CM | POA: Diagnosis present

## 2022-03-10 DIAGNOSIS — G40909 Epilepsy, unspecified, not intractable, without status epilepticus: Secondary | ICD-10-CM | POA: Diagnosis present

## 2022-03-10 DIAGNOSIS — R29810 Facial weakness: Secondary | ICD-10-CM | POA: Diagnosis present

## 2022-03-10 DIAGNOSIS — Z888 Allergy status to other drugs, medicaments and biological substances status: Secondary | ICD-10-CM

## 2022-03-10 DIAGNOSIS — Z1152 Encounter for screening for COVID-19: Secondary | ICD-10-CM

## 2022-03-10 DIAGNOSIS — Z602 Problems related to living alone: Secondary | ICD-10-CM | POA: Diagnosis present

## 2022-03-10 DIAGNOSIS — G936 Cerebral edema: Secondary | ICD-10-CM | POA: Diagnosis present

## 2022-03-10 DIAGNOSIS — Z79899 Other long term (current) drug therapy: Secondary | ICD-10-CM

## 2022-03-10 DIAGNOSIS — T424X5A Adverse effect of benzodiazepines, initial encounter: Secondary | ICD-10-CM | POA: Diagnosis not present

## 2022-03-10 DIAGNOSIS — Z66 Do not resuscitate: Secondary | ICD-10-CM | POA: Diagnosis not present

## 2022-03-10 DIAGNOSIS — Z515 Encounter for palliative care: Secondary | ICD-10-CM

## 2022-03-10 DIAGNOSIS — Z8052 Family history of malignant neoplasm of bladder: Secondary | ICD-10-CM

## 2022-03-10 DIAGNOSIS — G20A1 Parkinson's disease without dyskinesia, without mention of fluctuations: Secondary | ICD-10-CM | POA: Diagnosis present

## 2022-03-10 DIAGNOSIS — J690 Pneumonitis due to inhalation of food and vomit: Secondary | ICD-10-CM | POA: Diagnosis not present

## 2022-03-10 DIAGNOSIS — Z7189 Other specified counseling: Secondary | ICD-10-CM

## 2022-03-10 DIAGNOSIS — R0682 Tachypnea, not elsewhere classified: Secondary | ICD-10-CM | POA: Diagnosis not present

## 2022-03-10 LAB — COMPREHENSIVE METABOLIC PANEL
ALT: 10 U/L (ref 0–44)
AST: 17 U/L (ref 15–41)
Albumin: 4.2 g/dL (ref 3.5–5.0)
Alkaline Phosphatase: 63 U/L (ref 38–126)
Anion gap: 9 (ref 5–15)
BUN: 11 mg/dL (ref 8–23)
CO2: 26 mmol/L (ref 22–32)
Calcium: 8.9 mg/dL (ref 8.9–10.3)
Chloride: 103 mmol/L (ref 98–111)
Creatinine, Ser: 1.09 mg/dL (ref 0.61–1.24)
GFR, Estimated: >60 mL/min (ref >60)
Glucose, Bld: 99 mg/dL (ref 70–99)
Potassium: 3.7 mmol/L (ref 3.5–5.1)
Sodium: 138 mmol/L (ref 135–145)
Total Bilirubin: 1 mg/dL (ref 0.3–1.2)
Total Protein: 7.5 g/dL (ref 6.5–8.1)

## 2022-03-10 LAB — APTT: aPTT: 28 seconds (ref 24–36)

## 2022-03-10 LAB — CBC
HCT: 41.7 % (ref 39.0–52.0)
Hemoglobin: 14.3 g/dL (ref 13.0–17.0)
MCH: 30.9 pg (ref 26.0–34.0)
MCHC: 34.3 g/dL (ref 30.0–36.0)
MCV: 90.1 fL (ref 80.0–100.0)
Platelets: 176 10*3/uL (ref 150–400)
RBC: 4.63 MIL/uL (ref 4.22–5.81)
RDW: 12.9 % (ref 11.5–15.5)
WBC: 9.2 10*3/uL (ref 4.0–10.5)
nRBC: 0 % (ref 0.0–0.2)

## 2022-03-10 LAB — I-STAT CHEM 8, ED
BUN: 9 mg/dL (ref 8–23)
Calcium, Ion: 1.09 mmol/L — ABNORMAL LOW (ref 1.15–1.40)
Chloride: 103 mmol/L (ref 98–111)
Creatinine, Ser: 1.1 mg/dL (ref 0.61–1.24)
Glucose, Bld: 96 mg/dL (ref 70–99)
HCT: 44 % (ref 39.0–52.0)
Hemoglobin: 15 g/dL (ref 13.0–17.0)
Potassium: 3.9 mmol/L (ref 3.5–5.1)
Sodium: 141 mmol/L (ref 135–145)
TCO2: 25 mmol/L (ref 22–32)

## 2022-03-10 LAB — DIFFERENTIAL
Abs Immature Granulocytes: 0.03 10*3/uL (ref 0.00–0.07)
Basophils Absolute: 0.1 10*3/uL (ref 0.0–0.1)
Basophils Relative: 1 %
Eosinophils Absolute: 0 10*3/uL (ref 0.0–0.5)
Eosinophils Relative: 0 %
Immature Granulocytes: 0 %
Lymphocytes Relative: 21 %
Lymphs Abs: 2 10*3/uL (ref 0.7–4.0)
Monocytes Absolute: 0.8 10*3/uL (ref 0.1–1.0)
Monocytes Relative: 9 %
Neutro Abs: 6.3 10*3/uL (ref 1.7–7.7)
Neutrophils Relative %: 69 %

## 2022-03-10 LAB — CBG MONITORING, ED: Glucose-Capillary: 92 mg/dL (ref 70–99)

## 2022-03-10 LAB — ETHANOL: Alcohol, Ethyl (B): <10 mg/dL (ref <10)

## 2022-03-10 LAB — PROTIME-INR
INR: 1 (ref 0.8–1.2)
Prothrombin Time: 13.1 seconds (ref 11.4–15.2)

## 2022-03-10 NOTE — ED Provider Notes (Signed)
[facility] EMERGENCY DEPARTMENT Provider Note   CSN: [id number] Arrival date & time: 2022/03/21  2250     History {Add pertinent medical, surgical, social history, OB history to HPI:1} Chief Complaint  Patient presents with   Weakness   Level 5 caveat due to altered mental status Thomas Huber is a 66 y.o. male.  The history is provided by the patient.   Patient presents via EMS for possible stroke.  Patient was found by a neighbor unresponsive in his apartment complex.  Unknown last time known well.  Initial glucose was over 100.  It was reported patient had facial droop, slurred speech and left arm weakness.  No known history of stroke or seizures.  Patient does have a history of Parkinson's.    Home Medications Prior to Admission medications   Medication Sig Start Date End Date Taking? Authorizing Provider  carbidopa-levodopa (SINEMET IR) 25-100 MG tablet TAKE 3 TABLETS AT 9AM, 2 TABS AT 1PM, 1 TAB AT 5PM AND 1 TAB AT 9PM. 08/14/21   Tat, Octaviano Batty, DO  ibuprofen (ADVIL,MOTRIN) 200 MG tablet Take 200 mg by mouth as needed for headache.    [provider]      Allergies    Drug class [antihistamines, diphenhydramine-type]    Review of Systems   Review of Systems  Unable to perform ROS: Mental status change    Physical Exam Updated Vital Signs BP (!) 152/68   Pulse 85   Temp 99.2 F (37.3 C) (Rectal)   Resp 18   Ht 1.702 m (5\' 7" )   Wt 86.2 kg   SpO2 97%   BMI 29.76 kg/m  Physical Exam CONSTITUTIONAL: Disheveled HEAD: Normocephalic/atraumatic, no signs of trauma EYES: EOMI/PERRL ENMT: Mucous membranes moist, extremely poor dentition, upper teeth are bleeding around the gingiva.  No other signs of facial trauma NECK: supple no meningeal signs SPINE/BACK:entire spine nontender No bruising/crepitance/stepoffs noted to spine CV: S1/S2 noted, no murmurs/rubs/gallops noted LUNGS: Lungs are clear to auscultation bilaterally, no apparent distress ABDOMEN:  soft, nontender GU:no cva tenderness NEURO: Pt is awake/alert, he is slow to respond but is answering most questions appropriately, but speech is garbled.  He appears to have a left arm drift.  No other focal weakness is noted EXTREMITIES: pulses normal/equal, full ROM Pelvis stable All other extremities/joints palpated/ranged and nontender SKIN: warm, color normal   ED Results / Procedures / Treatments   Labs (all labs ordered are listed, but only abnormal results are displayed) Labs Reviewed  I-STAT CHEM 8, ED - Abnormal; Notable for the following components:      Result Value   Calcium, Ion 1.09 (*)    All other components within normal limits  RESP PANEL BY RT-PCR (RSV, FLU A&B, COVID)  RVPGX2  ETHANOL  PROTIME-INR  APTT  CBC  DIFFERENTIAL  COMPREHENSIVE METABOLIC PANEL  RAPID URINE DRUG SCREEN, HOSP PERFORMED  URINALYSIS, ROUTINE W REFLEX MICROSCOPIC  CBG MONITORING, ED    EKG EKG Interpretation  Date/Time:  Sunday 03-21-2022 23:03:47 EST Ventricular Rate:  84 PR Interval:  137 QRS Duration: 82 QT Interval:  370 QTC Calculation: 438 R Axis:   0 Text Interpretation: Sinus rhythm Low voltage, precordial leads Abnormal R-wave progression, early transition Confirmed by 11-05-1990 (Zadie Rhine) on 2022/03/21 11:09:55 PM  Radiology No results found.  Procedures Procedures  {Document cardiac monitor, telemetry assessment procedure when appropriate:1}  Medications Ordered in ED Medications - No data to display  ED Course/ Medical Decision Making/ A&P  Medical Decision Making Amount and/or Complexity of Data Reviewed Labs: ordered. Radiology: ordered.   This patient presents to the ED for concern of weakness, this involves an extensive number of treatment options, and is a complaint that carries with it a high risk of complications and morbidity.  The differential diagnosis includes but is not limited to CVA, intracranial  hemorrhage, acute coronary syndrome, renal failure, urinary tract infection, electrolyte disturbance, pneumonia    Comorbidities that complicate the patient evaluation: Patient's presentation is complicated by their history of Parkinson's  Social Determinants of Health: Patient's impaired access to primary care and lives alone   increases the complexity of managing their presentation  Additional history obtained: Records reviewed  neurology records reviewed  Lab Tests: I Ordered, and personally interpreted labs.  The pertinent results include: Labs unremarkable  Imaging Studies ordered: I ordered imaging studies including CT scan head and C-spine, chest x-ray   I independently visualized and interpreted imaging which showed *** I agree with the radiologist interpretation  Cardiac Monitoring: The patient was maintained on a cardiac monitor.  I personally viewed and interpreted the cardiac monitor which showed an underlying rhythm of:  {cardiac monitor:26849}  Medicines ordered and prescription drug management: I ordered medication including ***  for ***  Reevaluation of the patient after these medicines showed that the patient    {resolved/improved/worsened:23923::"improved"}  Test Considered: Patient is low risk / negative by ***, therefore do not feel that *** is indicated.  Critical Interventions:  ***  Consultations Obtained: I requested consultation with the {consultation:26851}, and discussed  findings as well as pertinent plan - they recommend: ***  Reevaluation: After the interventions noted above, I reevaluated the patient and found that they have :{resolved/improved/worsened:23923::"improved"}  Complexity of problems addressed: Patient's presentation is most consistent with  {CWCB:76283}  Disposition: After consideration of the diagnostic results and the patient's response to treatment,  I feel that the patent would benefit from {disposition:26850}.      {Document critical care time when appropriate:1} {Document review of labs and clinical decision tools ie heart score, Chads2Vasc2 etc:1}  {Document your independent review of radiology images, and any outside records:1} {Document your discussion with family members, caretakers, and with consultants:1} {Document social determinants of health affecting pt's care:1} {Document your decision making why or why not admission, treatments were needed:1} Final Clinical Impression(s) / ED Diagnoses Final diagnoses:  None    Rx / DC Orders ED Discharge Orders     None

## 2022-03-10 NOTE — ED Triage Notes (Signed)
Pt arrived via REMS for possible Code Stroke. REMS report Pt was found unresponsive by a neighbor in the apartment complex. Unknown, LKW. CBG for EMS 115. EDP at bedside for initial assessment. Pt noted to have left facial droop, slurred speech, and weakness in LUE. REMS report Pt on scene was conscious, alert and oriented. Per Pt, no Hx of stroke or seizure. Pt does have Hx of Parkinson's.

## 2022-03-10 NOTE — ED Notes (Signed)
ED Provider at bedside. 

## 2022-03-11 ENCOUNTER — Observation Stay (HOSPITAL_BASED_OUTPATIENT_CLINIC_OR_DEPARTMENT_OTHER): Payer: Medicare Other

## 2022-03-11 ENCOUNTER — Observation Stay (HOSPITAL_COMMUNITY): Payer: Medicare Other

## 2022-03-11 ENCOUNTER — Other Ambulatory Visit (HOSPITAL_COMMUNITY): Payer: Self-pay | Admitting: *Deleted

## 2022-03-11 DIAGNOSIS — Z1152 Encounter for screening for COVID-19: Secondary | ICD-10-CM | POA: Diagnosis not present

## 2022-03-11 DIAGNOSIS — E785 Hyperlipidemia, unspecified: Secondary | ICD-10-CM | POA: Diagnosis present

## 2022-03-11 DIAGNOSIS — R29810 Facial weakness: Secondary | ICD-10-CM | POA: Diagnosis present

## 2022-03-11 DIAGNOSIS — F1021 Alcohol dependence, in remission: Secondary | ICD-10-CM | POA: Diagnosis present

## 2022-03-11 DIAGNOSIS — Z66 Do not resuscitate: Secondary | ICD-10-CM | POA: Diagnosis not present

## 2022-03-11 DIAGNOSIS — Z597 Insufficient social insurance and welfare support: Secondary | ICD-10-CM | POA: Diagnosis not present

## 2022-03-11 DIAGNOSIS — I639 Cerebral infarction, unspecified: Secondary | ICD-10-CM | POA: Diagnosis not present

## 2022-03-11 DIAGNOSIS — G459 Transient cerebral ischemic attack, unspecified: Secondary | ICD-10-CM | POA: Diagnosis present

## 2022-03-11 DIAGNOSIS — Z7189 Other specified counseling: Secondary | ICD-10-CM | POA: Diagnosis not present

## 2022-03-11 DIAGNOSIS — Z79899 Other long term (current) drug therapy: Secondary | ICD-10-CM | POA: Diagnosis not present

## 2022-03-11 DIAGNOSIS — Z515 Encounter for palliative care: Secondary | ICD-10-CM | POA: Diagnosis not present

## 2022-03-11 DIAGNOSIS — E538 Deficiency of other specified B group vitamins: Secondary | ICD-10-CM | POA: Diagnosis present

## 2022-03-11 DIAGNOSIS — F0280 Dementia in other diseases classified elsewhere without behavioral disturbance: Secondary | ICD-10-CM | POA: Diagnosis present

## 2022-03-11 DIAGNOSIS — Z602 Problems related to living alone: Secondary | ICD-10-CM | POA: Diagnosis present

## 2022-03-11 DIAGNOSIS — R1310 Dysphagia, unspecified: Secondary | ICD-10-CM | POA: Diagnosis present

## 2022-03-11 DIAGNOSIS — I6389 Other cerebral infarction: Secondary | ICD-10-CM | POA: Diagnosis not present

## 2022-03-11 DIAGNOSIS — T424X5A Adverse effect of benzodiazepines, initial encounter: Secondary | ICD-10-CM | POA: Diagnosis not present

## 2022-03-11 DIAGNOSIS — Z888 Allergy status to other drugs, medicaments and biological substances status: Secondary | ICD-10-CM | POA: Diagnosis not present

## 2022-03-11 DIAGNOSIS — J690 Pneumonitis due to inhalation of food and vomit: Secondary | ICD-10-CM | POA: Diagnosis not present

## 2022-03-11 DIAGNOSIS — R0682 Tachypnea, not elsewhere classified: Secondary | ICD-10-CM | POA: Diagnosis present

## 2022-03-11 DIAGNOSIS — G20B1 Parkinson's disease with dyskinesia, without mention of fluctuations: Secondary | ICD-10-CM | POA: Diagnosis not present

## 2022-03-11 DIAGNOSIS — I63511 Cerebral infarction due to unspecified occlusion or stenosis of right middle cerebral artery: Secondary | ICD-10-CM | POA: Diagnosis present

## 2022-03-11 DIAGNOSIS — G20A1 Parkinson's disease without dyskinesia, without mention of fluctuations: Secondary | ICD-10-CM

## 2022-03-11 DIAGNOSIS — R4701 Aphasia: Secondary | ICD-10-CM | POA: Diagnosis present

## 2022-03-11 DIAGNOSIS — G936 Cerebral edema: Secondary | ICD-10-CM | POA: Diagnosis present

## 2022-03-11 DIAGNOSIS — G928 Other toxic encephalopathy: Secondary | ICD-10-CM | POA: Diagnosis present

## 2022-03-11 DIAGNOSIS — R29720 NIHSS score 20: Secondary | ICD-10-CM | POA: Diagnosis present

## 2022-03-11 DIAGNOSIS — Z8052 Family history of malignant neoplasm of bladder: Secondary | ICD-10-CM | POA: Diagnosis not present

## 2022-03-11 LAB — ECHOCARDIOGRAM COMPLETE
Area-P 1/2: 2.5 cm2
Height: 67 in
S' Lateral: 2.4 cm
Weight: 3040.58 oz

## 2022-03-11 LAB — COMPREHENSIVE METABOLIC PANEL
ALT: 12 U/L (ref 0–44)
AST: 16 U/L (ref 15–41)
Albumin: 3.8 g/dL (ref 3.5–5.0)
Alkaline Phosphatase: 61 U/L (ref 38–126)
Anion gap: 8 (ref 5–15)
BUN: 11 mg/dL (ref 8–23)
CO2: 25 mmol/L (ref 22–32)
Calcium: 9 mg/dL (ref 8.9–10.3)
Chloride: 106 mmol/L (ref 98–111)
Creatinine, Ser: 0.81 mg/dL (ref 0.61–1.24)
GFR, Estimated: >60 mL/min (ref >60)
Glucose, Bld: 118 mg/dL — ABNORMAL HIGH (ref 70–99)
Potassium: 3.7 mmol/L (ref 3.5–5.1)
Sodium: 139 mmol/L (ref 135–145)
Total Bilirubin: 1.2 mg/dL (ref 0.3–1.2)
Total Protein: 7 g/dL (ref 6.5–8.1)

## 2022-03-11 LAB — CBC
HCT: 43.8 % (ref 39.0–52.0)
Hemoglobin: 15 g/dL (ref 13.0–17.0)
MCH: 30.7 pg (ref 26.0–34.0)
MCHC: 34.2 g/dL (ref 30.0–36.0)
MCV: 89.8 fL (ref 80.0–100.0)
Platelets: 170 10*3/uL (ref 150–400)
RBC: 4.88 MIL/uL (ref 4.22–5.81)
RDW: 12.6 % (ref 11.5–15.5)
WBC: 9 10*3/uL (ref 4.0–10.5)
nRBC: 0 % (ref 0.0–0.2)

## 2022-03-11 LAB — URINALYSIS, ROUTINE W REFLEX MICROSCOPIC
Bacteria, UA: NONE SEEN
Bilirubin Urine: NEGATIVE
Glucose, UA: NEGATIVE mg/dL
Ketones, ur: 5 mg/dL — AB
Leukocytes,Ua: NEGATIVE
Nitrite: NEGATIVE
Protein, ur: NEGATIVE mg/dL
Specific Gravity, Urine: 1.014 (ref 1.005–1.030)
pH: 7 (ref 5.0–8.0)

## 2022-03-11 LAB — LIPID PANEL
Cholesterol: 143 mg/dL (ref 0–200)
HDL: 46 mg/dL (ref >40)
LDL Cholesterol: 85 mg/dL (ref 0–99)
Total CHOL/HDL Ratio: 3.1 RATIO
Triglycerides: 58 mg/dL (ref <150)
VLDL: 12 mg/dL (ref 0–40)

## 2022-03-11 LAB — HIV ANTIBODY (ROUTINE TESTING W REFLEX): HIV Screen 4th Generation wRfx: NONREACTIVE

## 2022-03-11 LAB — VITAMIN B12: Vitamin B-12: 109 pg/mL — ABNORMAL LOW (ref 180–914)

## 2022-03-11 LAB — RAPID URINE DRUG SCREEN, HOSP PERFORMED
Amphetamines: NOT DETECTED
Barbiturates: NOT DETECTED
Benzodiazepines: NOT DETECTED
Cocaine: NOT DETECTED
Opiates: NOT DETECTED
Tetrahydrocannabinol: NOT DETECTED

## 2022-03-11 LAB — RESP PANEL BY RT-PCR (RSV, FLU A&B, COVID)  RVPGX2
Influenza A by PCR: NEGATIVE
Influenza B by PCR: NEGATIVE
Resp Syncytial Virus by PCR: NEGATIVE
SARS Coronavirus 2 by RT PCR: NEGATIVE

## 2022-03-11 LAB — FOLATE: Folate: 8.5 ng/mL (ref >5.9)

## 2022-03-11 LAB — AMMONIA: Ammonia: 24 umol/L (ref 9–35)

## 2022-03-11 LAB — PHOSPHORUS: Phosphorus: 2.8 mg/dL (ref 2.5–4.6)

## 2022-03-11 LAB — MAGNESIUM: Magnesium: 2.1 mg/dL (ref 1.7–2.4)

## 2022-03-11 MED ORDER — CARBIDOPA-LEVODOPA 25-100 MG PO TABS
3.0000 | ORAL_TABLET | Freq: Every morning | ORAL | Status: DC
  Filled 2022-03-11 (×2): qty 3

## 2022-03-11 MED ORDER — STROKE: EARLY STAGES OF RECOVERY BOOK
Freq: Once | Status: DC
  Filled 2022-03-11: qty 1

## 2022-03-11 MED ORDER — ATORVASTATIN CALCIUM 40 MG PO TABS
40.0000 mg | ORAL_TABLET | Freq: Every day | ORAL | Status: DC
  Administered 2022-03-11: 40 mg via ORAL
  Filled 2022-03-11 (×2): qty 1

## 2022-03-11 MED ORDER — PERFLUTREN LIPID MICROSPHERE
1.0000 mL | INTRAVENOUS | Status: AC | PRN
  Administered 2022-03-11: 4 mL via INTRAVENOUS

## 2022-03-11 MED ORDER — ACETAMINOPHEN 325 MG PO TABS
650.0000 mg | ORAL_TABLET | Freq: Four times a day (QID) | ORAL | Status: DC | PRN
  Administered 2022-03-12 – 2022-03-13 (×2): 650 mg via ORAL
  Filled 2022-03-11 (×2): qty 2

## 2022-03-11 MED ORDER — ASPIRIN 81 MG PO TBEC: 81.0000 mg | DELAYED_RELEASE_TABLET | Freq: Every day | ORAL | Status: DC

## 2022-03-11 MED ORDER — DEXTROSE-NACL 5-0.9 % IV SOLN: INTRAVENOUS | Status: DC

## 2022-03-11 MED ORDER — CYANOCOBALAMIN 1000 MCG/ML IJ SOLN
1000.0000 ug | Freq: Every day | INTRAMUSCULAR | Status: DC
  Administered 2022-03-11: 1000 ug via INTRAMUSCULAR
  Filled 2022-03-11 (×2): qty 1

## 2022-03-11 MED ORDER — ONDANSETRON HCL 4 MG/2ML IJ SOLN: 4.0000 mg | Freq: Four times a day (QID) | INTRAMUSCULAR | Status: DC | PRN

## 2022-03-11 MED ORDER — CARBIDOPA-LEVODOPA 25-100 MG PO TABS: 1.0000 | ORAL_TABLET | Freq: Three times a day (TID) | ORAL | Status: DC

## 2022-03-11 MED ORDER — LORAZEPAM 2 MG/ML IJ SOLN
1.0000 mg | INTRAMUSCULAR | Status: AC
  Administered 2022-03-11: 1 mg via INTRAVENOUS
  Filled 2022-03-11: qty 1

## 2022-03-11 MED ORDER — ACETAMINOPHEN 650 MG RE SUPP: 650.0000 mg | Freq: Four times a day (QID) | RECTAL | Status: DC | PRN

## 2022-03-11 MED ORDER — ASPIRIN 300 MG RE SUPP
300.0000 mg | Freq: Every day | RECTAL | Status: DC
  Administered 2022-03-11 – 2022-03-17 (×7): 300 mg via RECTAL
  Filled 2022-03-11 (×7): qty 1

## 2022-03-11 MED ORDER — CLOPIDOGREL BISULFATE 75 MG PO TABS
75.0000 mg | ORAL_TABLET | Freq: Every day | ORAL | Status: DC
  Administered 2022-03-11: 75 mg via ORAL
  Filled 2022-03-11 (×2): qty 1

## 2022-03-11 MED ORDER — CARBIDOPA-LEVODOPA 25-100 MG PO TABS
1.0000 | ORAL_TABLET | Freq: Two times a day (BID) | ORAL | Status: DC
  Administered 2022-03-11 (×2): 1 via ORAL
  Filled 2022-03-11 (×6): qty 1

## 2022-03-11 MED ORDER — ONDANSETRON HCL 4 MG PO TABS: 4.0000 mg | ORAL_TABLET | Freq: Four times a day (QID) | ORAL | Status: DC | PRN

## 2022-03-11 MED ORDER — CARBIDOPA-LEVODOPA 25-100 MG PO TABS
2.0000 | ORAL_TABLET | Freq: Every day | ORAL | Status: DC
  Administered 2022-03-11: 2 via ORAL
  Filled 2022-03-11 (×3): qty 2

## 2022-03-11 NOTE — H&P (Addendum)
History and Physical    Patient: Thomas Huber EPP:[id number] DOB: 02-18-1956 DOA: 03/09/2022 DOS: the patient was seen and examined on 03/11/2022 PCP: Ignatius Specking, MD  Patient coming from: Home  Chief Complaint:  Chief Complaint  Patient presents with   Weakness   HPI: Thomas Huber is a 66 y.o. male with medical history significant of Parkinson disease who presents to the emergency department via EMS due to possible stroke.  Patient was alert and oriented x 3, but was unable to provide history of why he came to the ED.  History was obtained from ED physician and ED medical record.  Per report, he was found unresponsive in his apartment complex last known well was unknown.  EMS was activated and arrival of EMS team, blood glucose check was greater than 100, patient was reported to have facial droop slurred speech and left arm weakness.  Patient has no known history of seizures.  ED Course:  In the emergency department, he was intermittently tachypneic, BP was 152/84 and other vital signs were within normal range.  Workup in the ED showed normal CBC and BMP, alcohol level was less than 10.  Influenza A, B, SARS coronavirus 2, RSV was negative Chest x-ray showed no evidence of acute cardiopulmonary disease. CT head without contrast showed no acute intracranial abnormality CT cervical spine without contrast showed no traumatic injury of the cervical spine. Hospitalist was asked to admit patient for further evaluation and management.  Review of Systems: Review of systems as noted in the HPI. All other systems reviewed and are negative.   Past Medical History:  Diagnosis Date   Parkinson disease    Tremor    History reviewed. No pertinent surgical history.  Social History:  reports that he has never smoked. He has never used smokeless tobacco. He reports current alcohol use. He reports that he does not use drugs.   Allergies  Allergen Reactions   Drug Class [Antihistamines,  Diphenhydramine-Type] Other (See Comments)    Benadryl-makes patient incoherent     Family History  Problem Relation Age of Onset   Breast cancer Mother    Bladder Cancer Father      Prior to Admission medications   Medication Sig Start Date End Date Taking? Authorizing Provider  carbidopa-levodopa (SINEMET IR) 25-100 MG tablet TAKE 3 TABLETS AT 9AM, 2 TABS AT 1PM, 1 TAB AT 5PM AND 1 TAB AT 9PM. 08/14/21   Tat, Octaviano Batty, DO  ibuprofen (ADVIL,MOTRIN) 200 MG tablet Take 200 mg by mouth as needed for headache.    [provider]    Physical Exam: BP (!) 150/62   Pulse 69   Temp 99 F (37.2 C) (Oral)   Resp 17   Ht 5\' 7"  (1.702 m)   Wt 86.2 kg   SpO2 98%   BMI 29.76 kg/m   General: 66 y.o. year-old male well developed well nourished in no acute distress.  Alert and oriented x3. HEENT: NCAT, EOMI Neck: Supple, trachea medial Cardiovascular: Regular rate and rhythm with no rubs or gallops.  No thyromegaly or JVD noted.  No lower extremity edema. 2/4 pulses in all 4 extremities. Respiratory: Clear to auscultation with no wheezes or rales. Good inspiratory effort. Abdomen: Soft, nontender nondistended with normal bowel sounds x4 quadrants. Muskuloskeletal: No cyanosis, clubbing or edema noted bilaterally Neuro: Garbled speech (baseline unknown).  Slow response to questions, though responds appropriately.  Noted hand tremors.  CN II-XII intact, sensation, reflexes intact Skin: No ulcerative lesions noted  or rashes Psychiatry: Mood is appropriate for condition and setting          Labs on Admission:  Basic Metabolic Panel: Recent Labs  Lab 2022-03-21 2258 Mar 21, 2022 2320  NA 141 138  K 3.9 3.7  CL 103 103  CO2  --  26  GLUCOSE 96 99  BUN 9 11  CREATININE 1.10 1.09  CALCIUM  --  8.9   Liver Function Tests: Recent Labs  Lab 03-21-22 2320  AST 17  ALT 10  ALKPHOS 63  BILITOT 1.0  PROT 7.5  ALBUMIN 4.2   No results for input(s): "LIPASE", "AMYLASE" in the  last 168 hours. No results for input(s): "AMMONIA" in the last 168 hours. CBC: Recent Labs  Lab 03/21/22 2258 21-Mar-2022 2320  WBC  --  9.2  NEUTROABS  --  6.3  HGB 15.0 14.3  HCT 44.0 41.7  MCV  --  90.1  PLT  --  176   Cardiac Enzymes: No results for input(s): "CKTOTAL", "CKMB", "CKMBINDEX", "TROPONINI" in the last 168 hours.  BNP (last 3 results) No results for input(s): "BNP" in the last 8760 hours.  ProBNP (last 3 results) No results for input(s): "PROBNP" in the last 8760 hours.  CBG: Recent Labs  Lab March 21, 2022 2252  GLUCAP 92    Radiological Exams on Admission: DG Chest Port 1 View  Result Date: 03/11/2022 CLINICAL DATA:  Weakness EXAM: PORTABLE CHEST 1 VIEW COMPARISON:  None Available. FINDINGS: Lungs are clear.  No pleural effusion or pneumothorax. The heart is normal in size. IMPRESSION: No evidence of acute cardiopulmonary disease. Electronically Signed   By: Charline Bills M.D.   On: 03/11/2022 00:11   CT CERVICAL SPINE WO CONTRAST  Result Date: 03/11/2022 CLINICAL DATA:  Unresponsive EXAM: CT CERVICAL SPINE WITHOUT CONTRAST TECHNIQUE: Multidetector CT imaging of the cervical spine was performed without intravenous contrast. Multiplanar CT image reconstructions were also generated. RADIATION DOSE REDUCTION: This exam was performed according to the departmental dose-optimization program which includes automated exposure control, adjustment of the mA and/or kV according to patient size and/or use of iterative reconstruction technique. COMPARISON:  None Available. FINDINGS: Alignment: Normal cervical lordosis. Skull base and vertebrae: No acute fracture. No primary bone lesion or focal pathologic process. Soft tissues and spinal canal: No prevertebral fluid or swelling. No visible canal hematoma. Disc levels: Mild degenerative changes of the mid/lower cervical spine. Spinal canal is patent. Upper chest: Visualized lung apices are clear. Other: Visualized thyroid is  unremarkable. IMPRESSION: No traumatic injury of the cervical spine. Mild degenerative changes. Electronically Signed   By: Charline Bills M.D.   On: 03/11/2022 00:08   CT HEAD WO CONTRAST  Result Date: 03/11/2022 CLINICAL DATA:  Delirium. EXAM: CT HEAD WITHOUT CONTRAST TECHNIQUE: Contiguous axial images were obtained from the base of the skull through the vertex without intravenous contrast. RADIATION DOSE REDUCTION: This exam was performed according to the departmental dose-optimization program which includes automated exposure control, adjustment of the mA and/or kV according to patient size and/or use of iterative reconstruction technique. COMPARISON:  Head CT 03/18/2019 FINDINGS: Brain: No evidence of acute infarction, hemorrhage, hydrocephalus, extra-axial collection or mass lesion/mass effect. Vascular: No hyperdense vessel or unexpected calcification. Skull: Normal. Negative for fracture or focal lesion. Sinuses/Orbits: Patient is status post sinonasal surgery on the left. Mastoid air cells are clear. Chronic nasal bone fractures are present. Orbits are within normal limits. Other: None. IMPRESSION: No acute intracranial abnormality. Electronically Signed   By: Darliss Cheney  M.D.   On: 03/11/2022 00:07    EKG: I independently viewed the EKG done and my findings are as followed: Normal sinus rhythm at a rate of 84 bpm  Assessment/Plan Present on Admission:  TIA (transient ischemic attack)  Principal Problem:   TIA (transient ischemic attack)  Transient ischemic attack vs acute ischemic stroke Patient was reported to have left-sided weakness per medical record, however, this appears to have resolved at bedside, though patient was unable to perform left heel to right shin movements, he was able to perform right heel to left shin movements. Patient will be admitted to telemetry unit  Bilateral carotid ultrasound in the morning Echocardiogram in the morning MRI of brain without contrast in  the morning Continue aspirin  Continue fall precautions and neuro checks Lipid panel and hemoglobin A1c will be checked Continue PT/SLP/OT eval and treat Bedside swallow eval by nursing prior to diet Tele neurology was consulted and we shall await further recommendation  Parkinson's disease Continue Sinemet   DVT prophylaxis: SCDs  Code Status: Full code  Family Communication: None at bedside  Consults: Neurology  Severity of Illness: The appropriate patient status for this patient is OBSERVATION. Observation status is judged to be reasonable and necessary in order to provide the required intensity of service to ensure the patient's safety. The patient's presenting symptoms, physical exam findings, and initial radiographic and laboratory data in the context of their medical condition is felt to place them at decreased risk for further clinical deterioration. Furthermore, it is anticipated that the patient will be medically stable for discharge from the hospital within 2 midnights of admission.   Author: Frankey Shown, DO 03/11/2022 1:12 AM  For on call review [email or web address].

## 2022-03-11 NOTE — ED Notes (Addendum)
This nurse walking by pt room, noticed pt was standing beside bed, pt had urinated in bed and pants, pt had large soft brown stool in pants as well. Pt was cleaned up -old clothes removed, gown placed on pt, incontinence brief placed on pt- full bed linen change- Pt able to stand while cleaning him up and then assisted back in bed. pt will be moved to room 16 in view of nurses station.

## 2022-03-11 NOTE — ED Notes (Signed)
Hospitalist at bedside 

## 2022-03-11 NOTE — Progress Notes (Signed)
Pt in MRI  Virgina Organ, PT CLT [phone]

## 2022-03-11 NOTE — ED Provider Notes (Signed)
10:47 AM  Received call from radiology in regards to MRI ordered by inpatient team. 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 No mass effect or hemorrhagic transformation. Prior stroke noted (lacunar). Per EDP note unknown LNW. Will d/w hospitalist.    Tanda Rockers A, DO 03/11/22 1048

## 2022-03-11 NOTE — TOC Progression Note (Signed)
  Transition of Care [facility]) Screening Note   Patient Details  Name: Thomas Huber Date of Birth: 01-20-1956   Transition of Care [facility]) CM/SW Contact:    Elliot Gault, LCSW Phone Number: 03/11/2022, 10:58 AM    Anticipating pt will have TOC needs for dc. PT/OT unable to assess at this time due to pt's lethargy. Will follow up once pt more stable.  Transition of Care Department [facility]) has reviewed patient and no TOC needs have been identified at this time. We will continue to monitor patient advancement through interdisciplinary progression rounds. If new patient transition needs arise, please place a TOC consult.

## 2022-03-11 NOTE — Consult Note (Signed)
I connected with  Thomas Huber on 03/11/22 by a video enabled telemedicine application and verified that I am speaking with the correct person using two identifiers.   I discussed the limitations of evaluation and management by telemedicine. The patient expressed understanding and agreed to proceed.  Location of patient: [facility] Location of physician: [facility]   Neurology Consultation Reason for Consult: stroke Referring Physician: Dr Westley Foots  CC: Altered mental status  History is obtained from: Chart review as patient is altered  HPI: Thomas Huber is a 66 y.o. male with past medical history of Parkinson's disease who was brought in as a stroke alert on 03/04/2022.  Per EMS, patient was found unresponsive by a neighbor with an unknown last known normal.  On arrival to emergency room, patient was conscious, alert and oriented.  His blood sugar was over 92, blood pressure 152/84.  Overnight, patient continued to get agitated and received IV Ativan 1 mg at around 841 this morning.  Since then patient has been lethargic, barely opening eyes, not following commands and participating in exam.  I tried calling the number [phone] listed in epic but was not in service.  No other family members at bedside to get any further history  Last known normal: Unknown Event happened at home No tPA as unknown last known normal No thrombectomy as unknown last known normal MRIs: Difficult to obtain as patient is altered and no family at bedside  ROS: Unable to obtain due to altered mental status.   Past Medical History:  Diagnosis Date   Parkinson disease    Tremor     Family History  Problem Relation Age of Onset   Breast cancer Mother    Bladder Cancer Father     Social History:  reports that he has never smoked. He has never used smokeless tobacco. He reports current alcohol use. He reports that he does not use drugs.  Exam: Current vital signs: BP 130/71    Pulse 69   Temp 98.8 F (37.1 C) (Oral)   Resp 18   Ht 5\' 7"  (1.702 m)   Wt 86.2 kg   SpO2 96%   BMI 29.76 kg/m  Vital signs in last 24 hours: Temp:  [97.8 F (36.6 C)-99.2 F (37.3 C)] 98.8 F (37.1 C) (12/18 1022) Pulse Rate:  [65-92] 69 (12/18 1100) Resp:  [12-23] 18 (12/18 1100) BP: (116-162)/(62-97) 130/71 (12/18 1100) SpO2:  [93 %-98 %] 96 % (12/18 1100) Weight:  [86.2 kg] 86.2 kg (12/17 2304)   Physical Exam  Constitutional: Appears well-developed and well-nourished.  Neuro: Barely opens eyes to repeated tactile and noxious stimulation, PERRLA, no forced gaze deviation, left facial droop, withdraws to noxious stimuli in all 4 extremities with antigravity strength, unable to assess for aphasia and coordination  NIHSS 20  INPUTS: 1A: Level of consciousness --> 2 = Requires repeated stimulation to arouse 1B: Ask month and age --> 1 = Dysarthric/intubated/trauma/language barrier  1C: 'Blink eyes' & 'squeeze hands' --> 2 = Performs 0 tasks 2: Horizontal extraocular movements --> 0 = Normal 3: Visual fields --> 0 = No visual loss 4: Facial palsy --> 2 = Partial paralysis (lower face) 5A: Left arm motor drift --> 2 = Some effort against gravity 5B: Right arm motor drift --> 2 = Some effort against gravity 6A: Left leg motor drift --> 2 = Some effort against gravity 6B: Right leg motor drift --> 2 = Some effort against gravity 7: Limb Ataxia -->  0 = Does not understand 8: Sensation --> 2 = Coma/unresponsive 9: Language/aphasia --> 3 = Coma/unresponsive 10: Dysarthria --> 0 = Intubated/unable to test 11: Extinction/inattention --> 0 = No abnormality   I have reviewed labs in epic and the results pertinent to this consultation are: CBC:  Recent Labs  Lab 04-06-22 2320 03/11/22 0616  WBC 9.2 9.0  NEUTROABS 6.3  --   HGB 14.3 15.0  HCT 41.7 43.8  MCV 90.1 89.8  PLT 176 170    Basic Metabolic Panel:  Lab Results  Component Value Date   NA 139 03/11/2022   K  3.7 03/11/2022   CO2 25 03/11/2022   GLUCOSE 118 (H) 03/11/2022   BUN 11 03/11/2022   CREATININE 0.81 03/11/2022   CALCIUM 9.0 03/11/2022   GFRNONAA >60 03/11/2022   GFRAA >60 03/18/2019   Lipid Panel:  Lab Results  Component Value Date   LDLCALC 85 03/11/2022   HgbA1c: No results found for: "HGBA1C" Urine Drug Screen:     Component Value Date/Time   LABOPIA NONE DETECTED 03/11/2022 0822   COCAINSCRNUR NONE DETECTED 03/11/2022 0822   LABBENZ NONE DETECTED 03/11/2022 0822   AMPHETMU NONE DETECTED 03/11/2022 0822   THCU NONE DETECTED 03/11/2022 0822   LABBARB NONE DETECTED 03/11/2022 1505    Alcohol Level     Component Value Date/Time   ETH <10 April 06, 2022 2320     I have reviewed the images obtained:  MRI Brain 03/11/2022:  Positive for acute Right MCA territory infarct, middle sylvian division. Cytotoxic edema with no hemorrhage or mass effect. Minimal underlying chronic small vessel disease.  MRA head without contrast 03/11/2022:  Positive for Right MCA M2 Occlusion: middle and/or anterior division branch origin is occluded at the MCA bifurcation. 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  US Carotid 03/11/2022: Mild amount of plaque at the level of both carotid bulbs and proximal internal carotid arteries, right greater than left. No significant carotid stenosis identified. Estimated bilateral ICA stenoses are less than 50%.     ASSESSMENT/PLAN: 66 year old male with past medical history of Parkinson's disease who was brought in by EMS with altered mental status and MRI brain showed acute right MCA stroke with right M2 occlusion.   Acute ischemic stroke, right MCA Right M2 occlusion Parkinson's disease Vitamin B12 deficiency Cerebral edema Acute encephalopathy -Etiology: Embolic, likely cardioembolic versus atheroembolic -Cerebral edema due to  stroke - Encephalopathy likely due to Ativan use.  Recommendations: -Received aspirin suppository 300 mg.  Recommend starting aspirin 81 mg and Plavix 75 mg daily for 3 weeks followed by aspirin 81 mg daily -LDL is 85.  Therefore recommend starting atorvastatin 40 mg daily -A1c ordered and pending -TTE ordered and pending.  If no thrombus/vegetations, recommend 30-day event monitor at the time of discharge -Recommend B12 replacement, will add folate level as well -Recommend NG tube placement to resume patient's home Sinemet.  I ordered the correct dosing for most recent note from Dr. Arbutus Leas -Goal blood pressure: Normotension patient appears to have completed the stroke -PT/OT/SLP -Follow-up with neurology in 3 months -Discussed plan with Dr. [place]  Thank you for allowing [place] to participate in the care of this patient. If you have any further questions, please contact  me or neurohospitalist.   Lindie Spruce [facility] neuro[facility]ist

## 2022-03-11 NOTE — Progress Notes (Signed)
*  PRELIMINARY RESULTS* Echocardiogram 2D Echocardiogram has been performed with Definity.  Stacey Drain 03/11/2022, 3:09 PM

## 2022-03-11 NOTE — Progress Notes (Signed)
PROGRESS NOTE    Thomas Huber  TSV:[id number] DOB: 10-16-1955 DOA: March 30, 2022 PCP: Ignatius Specking, MD    Brief Narrative:   Thomas Huber is a 66 y.o. male with past medical history significant for Parkinson disease follows with neurology outpatient who presented to [facility] ED on 12/17 via EMS after he was found unresponsive in his apartment by a neighbor.  History obtained from ED physician and medical record as patient was confused and no family/friends present.  Unknown last time normal.  Initial glucose was greater than 100 and patient was reported by EMS to have a facial droop, slurred speech and left arm weakness.  No previous history of CVA or seizures.  In the ED, temperature 98.7 F, HR 83, RR 16, BP 152/68, SpO2 98% on room air.  WBC 9.2, hemoglobin 14.3, platelets 176.  Sodium 138, potassium four 3.7, chloride 103, CO2 26, glucose 99, BUN 11, creatinine 1.09.  AST 17, ALT 10, total bilirubin 1.0.  Cova-19 PCR negative.  Influenza A/B PCR negative.  EtOH level less than 10.  UDS negative.  Urinalysis with negative leukocytes, negative nitrite, no bacteria seen.  CT head/C-spine with no acute intracranial normality, no traumatic injury of the C-spine with mild degenerative changes.  EDP consulted [facility] for admission for further evaluation and management of concerns for TIA versus CVA.  Assessment & Plan:   Acute right MCA CVA Patient presenting to ED after being found unresponsive in his apartment by a neighbor.  Unknown last known normal time.  CT head/C-spine unrevealing.  MR brain with acute right MCA territory infarct with cytotoxic edema with no hemorrhage or mass effect.  MRA head without contrast positive for right MCA M2 occlusion.  Carotid ultrasound with mild amount of plaque in both carotid bulbs and proximal ICA, right greater than left, no significant carotid stenosis identified. -- Neurology following, appreciate assistance -- TTE: Pending; if no thrombus/vegetations,  neurology recommends 30-day event monitor -- LDL 85 -- Hemoglobin A1c: Pending -- Failed bedside swallow, NG tube placed -- Aspirin 81 mg p.o. daily, Plavix 75 mg p.o. daily -- Atorvastatin 40 mg p.o. daily -- D5LR at 16mL/h -- Pending PT/OT/SLP evaluation -- Aspiration precautions  Acute metabolic encephalopathy Etiology likely secondary to acute CVA as above.  No electrolyte disturbance, ammonia level within normal limits.  Glucose normal.  UDS negative.  Urinalysis unremarkable.  Chest x-ray with no evidence of acute cardiopulmonary disease process.  B12 deficiency B12 level low at 109. --B12 [place] daily x 7 days  Parkinson's disease Follows with neurology outpatient. -- Continue Sinemet  DVT prophylaxis: SCDs Start: 03/11/22 0447    Code Status: Full Code Family Communication: No family present at bedside  Disposition Plan:  Level of care: Telemetry Status is: Observation The patient remains OBS appropriate and will d/c before 2 midnights.    Consultants:  Neurology  Procedures:  TTE: Pending  Antimicrobials:  None   Subjective: Patient seen examined bedside, resting comfortably.  Lying in bed.  Confused, slightly agitated.  Not following commands appropriately.  Unable to obtain any further ROS due to his current mental status.  Seen by neurology this morning, pending further workup.  No acute concerns overnight per nursing staff.  Objective: Vitals:   03/11/22 1100 03/11/22 1230 03/11/22 1300 03/11/22 1400  BP: 130/71 126/69 (!) 144/78 (!) 146/65  Pulse: 69 76 93 70  Resp: 18 19 20 18   Temp:      TempSrc:      SpO2:  96% 97% 97% 97%  Weight:      Height:       No intake or output data in the 24 hours ending 03/11/22 1412 Filed Weights   Mar 22, 2022 2304  Weight: 86.2 kg    Examination:  Physical Exam: GEN: NAD, alert, confused, chronically ill in appearance, appears older than stated age HEENT: NCAT, PERRL, EOMI, sclera clear, dry mucous  membranes PULM: CTAB w/o wheezes/crackles, normal respiratory effort, on room air CV: RRR w/o M/G/R GI: abd soft, NTND, NABS, no R/G/M MSK: no peripheral edema, moves all extremities independently NEURO: Unable to fully obtain as patient's not participating in physical exam due to his mental status PSYCH: Confused/altered Integumentary: dry/intact, no rashes or wounds    Data Reviewed: I have personally reviewed following labs and imaging studies  CBC: Recent Labs  Lab 03-22-2022 2258 03-22-2022 2320 03/11/22 0616  WBC  --  9.2 9.0  NEUTROABS  --  6.3  --   HGB 15.0 14.3 15.0  HCT 44.0 41.7 43.8  MCV  --  90.1 89.8  PLT  --  176 170   Basic Metabolic Panel: Recent Labs  Lab 03/22/22 2258 03-22-2022 2320 03/11/22 0616  NA 141 138 139  K 3.9 3.7 3.7  CL 103 103 106  CO2  --  26 25  GLUCOSE 96 99 118*  BUN CREATININE 1.10 1.09 0.81  CALCIUM  --  8.9 9.0  MG  --   --  2.1  PHOS  --   --  2.8   GFR: Estimated Creatinine Clearance: 94 mL/min (by C-G formula based on SCr of 0.81 mg/dL). Liver Function Tests: Recent Labs  Lab 2022-03-22 2320 03/11/22 0616  AST 17 16  ALT 10 12  ALKPHOS 63 61  BILITOT 1.0 1.2  PROT 7.5 7.0  ALBUMIN 4.2 3.8   No results for input(s): "LIPASE", "AMYLASE" in the last 168 hours. Recent Labs  Lab 03/11/22 1033  AMMONIA 24   Coagulation Profile: Recent Labs  Lab 03/22/2022 2320  INR 1.0   Cardiac Enzymes: No results for input(s): "CKTOTAL", "CKMB", "CKMBINDEX", "TROPONINI" in the last 168 hours. BNP (last 3 results) No results for input(s): "PROBNP" in the last 8760 hours. HbA1C: No results for input(s): "HGBA1C" in the last 72 hours. CBG: Recent Labs  Lab Mar 22, 2022 2252  GLUCAP 92   Lipid Profile: Recent Labs    03/11/22 0616  CHOL 143  HDL 46  LDLCALC 85  TRIG 58  CHOLHDL 3.1   Thyroid Function Tests: No results for input(s): "TSH", "T4TOTAL", "FREET4", "T3FREE", "THYROIDAB" in the last 72 hours. Anemia  Panel: Recent Labs    03/11/22 0615 03/11/22 1213  VITAMINB12 109*  --   FOLATE  --  8.5   Sepsis Labs: No results for input(s): "PROCALCITON", "LATICACIDVEN" in the last 168 hours.  Recent Results (from the past 240 hour(s))  Resp panel by RT-PCR (RSV, Flu A&B, Covid) Anterior Nasal Swab     Status: None   Collection Time: 2022/03/22 11:20 PM   Specimen: Anterior Nasal Swab  Result Value Ref Range Status   SARS Coronavirus 2 by RT PCR NEGATIVE NEGATIVE Final    Comment: (NOTE) SARS-CoV-2 target nucleic acids are NOT DETECTED.  The SARS-CoV-2 RNA is generally detectable in upper respiratory specimens during the acute phase of infection. The lowest concentration of SARS-CoV-2 viral copies this assay can detect is 138 copies/mL. A negative result does not preclude SARS-Cov-2 infection and should not  be used as the sole basis for treatment or other patient management decisions. A negative result may occur with  improper specimen collection/handling, submission of specimen other than nasopharyngeal swab, presence of viral mutation(s) within the areas targeted by this assay, and inadequate number of viral copies(<138 copies/mL). A negative result must be combined with clinical observations, patient history, and epidemiological information. The expected result is Negative.  Fact Sheet for Patients:  BloggerCourse.com  Fact Sheet for Healthcare Providers:  SeriousBroker.it  This test is no t yet approved or cleared by the [place] FDA and  has been authorized for detection and/or diagnosis of SARS-CoV-2 by FDA under an Emergency Use Authorization (EUA). This EUA will remain  in effect (meaning this test can be used) for the duration of the COVID-19 declaration under Section 564(b)(1) of the Act, 21 U.S.C.section 360bbb-3(b)(1), unless the authorization is terminated  or revoked sooner.       Influenza A by PCR NEGATIVE  NEGATIVE Final   Influenza B by PCR NEGATIVE NEGATIVE Final    Comment: (NOTE) The Xpert Xpress SARS-CoV-2/FLU/RSV plus assay is intended as an aid in the diagnosis of influenza from Nasopharyngeal swab specimens and should not be used as a sole basis for treatment. Nasal washings and aspirates are unacceptable for Xpert Xpress SARS-CoV-2/FLU/RSV testing.  Fact Sheet for Patients: BloggerCourse.com  Fact Sheet for Healthcare Providers: SeriousBroker.it  This test is not yet approved or cleared by the [place] FDA and has been authorized for detection and/or diagnosis of SARS-CoV-2 by FDA under an Emergency Use Authorization (EUA). This EUA will remain in effect (meaning this test can be used) for the duration of the COVID-19 declaration under Section 564(b)(1) of the Act, 21 U.S.C. section 360bbb-3(b)(1), unless the authorization is terminated or revoked.     Resp Syncytial Virus by PCR NEGATIVE NEGATIVE Final    Comment: (NOTE) Fact Sheet for Patients: BloggerCourse.com  Fact Sheet for Healthcare Providers: SeriousBroker.it  This test is not yet approved or cleared by the [place] FDA and has been authorized for detection and/or diagnosis of SARS-CoV-2 by FDA under an Emergency Use Authorization (EUA). This EUA will remain in effect (meaning this test can be used) for the duration of the COVID-19 declaration under Section 564(b)(1) of the Act, 21 U.S.C. section 360bbb-3(b)(1), unless the authorization is terminated or revoked.  Performed at [facility], [address] [place], [place] [address]          Radiology Studies: DG Abdomen 1 View  Result Date: 03/11/2022 CLINICAL DATA:  NG tube placement EXAM: ABDOMEN - 1 VIEW COMPARISON:  None Available. FINDINGS: Tip of enteric tube is seen in the region of distal antrum of the stomach. There is presence of gas  in few nondilated small bowel loops in upper abdomen. Lower abdomen and pelvis are not included in the image. IMPRESSION: Tip of enteric tube is seen in the distal antrum of the stomach. Electronically Signed   By: Ernie Avena M.D.   On: 03/11/2022 13:15   US Carotid Bilateral  Result Date: 03/11/2022 CLINICAL DATA:  Acute right MCA territory cerebral infarction. EXAM: BILATERAL CAROTID DUPLEX ULTRASOUND TECHNIQUE: Wallace Cullens scale imaging, color Doppler and duplex ultrasound were performed of bilateral carotid and vertebral arteries in the neck. COMPARISON:  None Available. FINDINGS: Criteria: Quantification of carotid stenosis is based on velocity parameters that correlate the residual internal carotid diameter with NASCET-based stenosis levels, using the diameter of the distal internal carotid lumen as the denominator for stenosis  measurement. The following velocity measurements were obtained: RIGHT ICA:  58/16 cm/sec CCA:  83/11 cm/sec SYSTOLIC ICA/CCA RATIO:  0.7 ECA:  112 cm/sec LEFT ICA:  62/17 cm/sec CCA:  97/10 cm/sec SYSTOLIC ICA/CCA RATIO:  0.6 ECA:  93 cm/sec RIGHT CAROTID ARTERY: Mild amount of partially calcified plaque at the level of the carotid bulb and proximal right ICA. Estimated right ICA stenosis is less than 50%. RIGHT VERTEBRAL ARTERY: Antegrade flow with normal waveform and velocity. LEFT CAROTID ARTERY: Minimal partially calcified plaque at the level of the left carotid bulb and proximal ICA. Estimated left ICA stenosis is less than 50% LEFT VERTEBRAL ARTERY: Antegrade flow with normal waveform and velocity. IMPRESSION: Mild amount of plaque at the level of both carotid bulbs and proximal internal carotid arteries, right greater than left. No significant carotid stenosis identified. Estimated bilateral ICA stenoses are less than 50%. Electronically Signed   By: Irish Lack M.D.   On: 03/11/2022 11:27   MR BRAIN WO CONTRAST  Addendum Date: 03/11/2022   ADDENDUM REPORT:  03/11/2022 10:53 ADDENDUM: Study discussed with Dr. Tanda Rockers in the ED by telephone on 03/11/2022 at 10:44 . Electronically Signed   By: Odessa Fleming M.D.   On: 03/11/2022 10:53   Result Date: 03/11/2022 CLINICAL DATA:  66 year old male altered mental status. Neurologic deficit. EXAM: MRI HEAD WITHOUT CONTRAST TECHNIQUE: Multiplanar, multiecho pulse sequences of the brain and surrounding structures were obtained without intravenous contrast. COMPARISON:  Head CT yesterday. FINDINGS: Brain: Confluent restricted diffusion in the right insula and frontal operculum in an area of about 3.5 cm (series 5, image 20), tracking cephalad to toward the superior frontal gyrus. T2 and FLAIR hyperintense Cytotoxic edema. No hemorrhagic transformation. No mass effect. No contralateral left hemisphere or posterior fossa restricted diffusion. No midline shift, mass effect, evidence of mass lesion, ventriculomegaly, extra-axial collection or acute intracranial hemorrhage. Cervicomedullary junction and pituitary are within normal limits. Outside of the 8 acutely affected parenchyma gray and white matter signal is largely normal for age. Tiny chronic infarct in the right cerebellum series 15, image 1. Probable tiny chronic lacunar infarct right caudate nucleus series 18, image 29. Vascular: Major intracranial vascular flow voids are preserved. See MRA today reported separately. Skull and upper cervical spine: Negative. Visualized bone marrow signal is within normal limits. Orbits/sinuses: Negative orbits. Paranasal sinuses and mastoids are stable and well aerated. Other: Negative visible scalp and face. IMPRESSION: 1. Positive for acute Right MCA territory infarct, middle sylvian division. Cytotoxic edema with no hemorrhage or mass effect. 2. Minimal underlying chronic small vessel disease. 3. MRA reported separately. Electronically Signed: By: Odessa Fleming M.D. On: 03/11/2022 10:38   MR ANGIO HEAD WO CONTRAST  Result Date:  03/11/2022 CLINICAL DATA:  66 year old male altered mental status. Neurologic deficit. Right MCA infarct. EXAM: MRA HEAD WITHOUT CONTRAST TECHNIQUE: Angiographic images of the Circle of Willis were acquired using MRA technique without intravenous contrast. COMPARISON:  Head CT yesterday. Brain MRI today reported separately. FINDINGS: Anterior circulation: Intermittent mild motion artifact. Antegrade flow in both ICA siphons to the carotid termini. Patent MCA and ACA origins. Mild to moderate bilateral supraclinoid ICA stenosis suspected. Anterior communicating artery and visible ACA branches are within normal limits, the right ACA A2 might be dominant. Left MCA M1 segment and bifurcation are patent. Right MCA M1 segment is patent, but there is occlusion of the M2 middle sylvian branch at its origin at the bifurcation on series 10, image 106. The posterior M2 branch remains  patent. Posterior circulation: Antegrade flow in the posterior circulation, the distal right vertebral artery appears dominant and the left appears to terminates in PICA. No visible distal vertebral stenosis. Patent basilar artery without stenosis. Patent basilar tip, SCA and PCA origins. Posterior communicating arteries are diminutive or absent. Motion artifact of the affects bilateral PCA branch detail, proximal PCAs are patent. Anatomic variants: Dominant right vertebral artery appears to supply the basilar. Right ACA A2 may be dominant. Other: MRI head at the same time is reported separately. IMPRESSION: 1. Positive for Right MCA M2 Occlusion: middle and/or anterior division branch origin is occluded at the MCA bifurcation. 2. 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 Study discussed with Dr. Tanda Rockers in the ED by telephone on 03/11/2022 at 10:44 . Electronically Signed   By: Odessa Fleming M.D.   On: 03/11/2022 10:53   DG Chest Port  1 View  Result Date: 03/11/2022 CLINICAL DATA:  Weakness EXAM: PORTABLE CHEST 1 VIEW COMPARISON:  None Available. FINDINGS: Lungs are clear.  No pleural effusion or pneumothorax. The heart is normal in size. IMPRESSION: No evidence of acute cardiopulmonary disease. Electronically Signed   By: Charline Bills M.D.   On: 03/11/2022 00:11   CT CERVICAL SPINE WO CONTRAST  Result Date: 03/11/2022 CLINICAL DATA:  Unresponsive EXAM: CT CERVICAL SPINE WITHOUT CONTRAST TECHNIQUE: Multidetector CT imaging of the cervical spine was performed without intravenous contrast. Multiplanar CT image reconstructions were also generated. RADIATION DOSE REDUCTION: This exam was performed according to the departmental dose-optimization program which includes automated exposure control, adjustment of the mA and/or kV according to patient size and/or use of iterative reconstruction technique. COMPARISON:  None Available. FINDINGS: Alignment: Normal cervical lordosis. Skull base and vertebrae: No acute fracture. No primary bone lesion or focal pathologic process. Soft tissues and spinal canal: No prevertebral fluid or swelling. No visible canal hematoma. Disc levels: Mild degenerative changes of the mid/lower cervical spine. Spinal canal is patent. Upper chest: Visualized lung apices are clear. Other: Visualized thyroid is unremarkable. IMPRESSION: No traumatic injury of the cervical spine. Mild degenerative changes. Electronically Signed   By: Charline Bills M.D.   On: 03/11/2022 00:08   CT HEAD WO CONTRAST  Result Date: 03/11/2022 CLINICAL DATA:  Delirium. EXAM: CT HEAD WITHOUT CONTRAST TECHNIQUE: Contiguous axial images were obtained from the base of the skull through the vertex without intravenous contrast. RADIATION DOSE REDUCTION: This exam was performed according to the departmental dose-optimization program which includes automated exposure control, adjustment of the mA and/or kV according to patient size and/or use  of iterative reconstruction technique. COMPARISON:  Head CT 03/18/2019 FINDINGS: Brain: No evidence of acute infarction, hemorrhage, hydrocephalus, extra-axial collection or mass lesion/mass effect. Vascular: No hyperdense vessel or unexpected calcification. Skull: Normal. Negative for fracture or focal lesion. Sinuses/Orbits: Patient is status post sinonasal surgery on the left. Mastoid air cells are clear. Chronic nasal bone fractures are present. Orbits are within normal limits. Other: None. IMPRESSION: No acute intracranial abnormality. Electronically Signed   By: Darliss Cheney M.D.   On: 03/11/2022 00:07        Scheduled Meds:   stroke: early stages of recovery book   Does not apply Once   aspirin  300 mg Rectal Daily   atorvastatin  40 mg Oral Daily   [START ON 03/12/2022] carbidopa-levodopa  3 tablet Oral q morning   And   carbidopa-levodopa  2 tablet Oral  Q1200   And   carbidopa-levodopa  1 tablet Oral BID   clopidogrel  75 mg Oral Daily   cyanocobalamin  1,000 mcg Intramuscular Daily   Continuous Infusions:   LOS: 0 days    Time spent: 56 minutes spent on chart review, discussion with nursing staff, consultants, updating family and interview/physical exam; more than 50% of that time was spent in counseling and/or coordination of care.    Alvira PhilipsEric J [place], DO [facility] Available via Epic secure chat 7am-7pm After these hours, please refer to coverage provider listed on [email or web address] 03/11/2022, 2:12 PM

## 2022-03-11 NOTE — ED Notes (Signed)
Dr. [place] notified of pt's agitation, confusion, and continued attempts to exit bed. Pt unable to follow commands. Night shift reported that pt had exited bed on his own twice. Pt continuously moving and attempting to exit bed. Orders requested and received for soft rist restraints.

## 2022-03-11 NOTE — ED Notes (Addendum)
Dr. [place] messaged on secure chat to activate a code stroke.  1110 stroke cart activated and at 1115 Anisa RN advised Dr. Selina Cooley states this is not a code stroke and she will consult Dr. [place] to inform him.

## 2022-03-11 NOTE — Progress Notes (Signed)
OT Cancellation Note  Patient Details Name: Thomas Huber MRN: [id number] DOB: 1955-11-19   Cancelled Treatment:    Reason Eval/Treat Not Completed: Fatigue/lethargy limiting ability to participate. Pt lethargic. Sternum rub completed with pt siring some. Nursing requested pt be seen at a later time. Will attempt to see pt when less lethargic and able to participate.  Natasia Sanko OT, MOT   [name] 03/11/2022, 10:53 AM

## 2022-03-11 NOTE — Progress Notes (Signed)
SLP Cancellation Note  Patient Details Name: Idus Rathke MRN: [id number] DOB: 1955-08-05   Cancelled treatment:       Reason Eval/Treat Not Completed: Fatigue/lethargy limiting ability to participate;Patient's level of consciousness. Pt is not appropriate for PO trials at this time, ST will continue efforts and will re-attempt when alert. Thank you,  Saraya Tirey H. Romie Levee, CCC-SLP Speech Language Pathologist    Georgetta Haber 03/11/2022, 2:16 PM

## 2022-03-11 NOTE — Consult Note (Signed)
Code stroke cart activated at 1110. RN gave report with unknown LKW and MRI showing acute infarct. Attending MD requesting code stroke. TSRN spoke with Dr Selina Cooley over phone who states this is should not be a code stroke and she will follow up with Dr [place] for recommendations. Cancelled code stroke and off camera at 1117.

## 2022-03-12 DIAGNOSIS — G20B1 Parkinson's disease with dyskinesia, without mention of fluctuations: Secondary | ICD-10-CM | POA: Diagnosis not present

## 2022-03-12 DIAGNOSIS — E538 Deficiency of other specified B group vitamins: Secondary | ICD-10-CM | POA: Diagnosis not present

## 2022-03-12 DIAGNOSIS — I63511 Cerebral infarction due to unspecified occlusion or stenosis of right middle cerebral artery: Secondary | ICD-10-CM | POA: Diagnosis not present

## 2022-03-12 LAB — TSH: TSH: 1.379 u[IU]/mL (ref 0.350–4.500)

## 2022-03-12 LAB — BASIC METABOLIC PANEL
Anion gap: 7 (ref 5–15)
BUN: 10 mg/dL (ref 8–23)
CO2: 26 mmol/L (ref 22–32)
Calcium: 9 mg/dL (ref 8.9–10.3)
Chloride: 107 mmol/L (ref 98–111)
Creatinine, Ser: 0.93 mg/dL (ref 0.61–1.24)
GFR, Estimated: >60 mL/min (ref >60)
Glucose, Bld: 114 mg/dL — ABNORMAL HIGH (ref 70–99)
Potassium: 4.1 mmol/L (ref 3.5–5.1)
Sodium: 140 mmol/L (ref 135–145)

## 2022-03-12 LAB — HEMOGLOBIN A1C
Hgb A1c MFr Bld: 5.2 % (ref 4.8–5.6)
Mean Plasma Glucose: 103 mg/dL

## 2022-03-12 LAB — CBC
HCT: 44.2 % (ref 39.0–52.0)
Hemoglobin: 15 g/dL (ref 13.0–17.0)
MCH: 30.8 pg (ref 26.0–34.0)
MCHC: 33.9 g/dL (ref 30.0–36.0)
MCV: 90.8 fL (ref 80.0–100.0)
Platelets: 149 10*3/uL — ABNORMAL LOW (ref 150–400)
RBC: 4.87 MIL/uL (ref 4.22–5.81)
RDW: 12.6 % (ref 11.5–15.5)
WBC: 9 10*3/uL (ref 4.0–10.5)
nRBC: 0 % (ref 0.0–0.2)

## 2022-03-12 LAB — PROCALCITONIN: Procalcitonin: <0.1 ng/mL

## 2022-03-12 LAB — RPR: RPR Ser Ql: NONREACTIVE

## 2022-03-12 LAB — CBG MONITORING, ED: Glucose-Capillary: 100 mg/dL — ABNORMAL HIGH (ref 70–99)

## 2022-03-12 LAB — LACTIC ACID, PLASMA: Lactic Acid, Venous: 1.5 mmol/L (ref 0.5–1.9)

## 2022-03-12 LAB — CK: Total CK: 494 U/L — ABNORMAL HIGH (ref 49–397)

## 2022-03-12 LAB — T4, FREE: Free T4: 1.08 ng/dL (ref 0.61–1.12)

## 2022-03-12 MED ORDER — CARBIDOPA-LEVODOPA 25-100 MG PO TABS
2.0000 | ORAL_TABLET | Freq: Every day | ORAL | Status: DC
  Administered 2022-03-12 – 2022-03-17 (×6): 2 via NASOGASTRIC
  Filled 2022-03-12 (×7): qty 2

## 2022-03-12 MED ORDER — CARBIDOPA-LEVODOPA 25-100 MG PO TABS
3.0000 | ORAL_TABLET | Freq: Every morning | ORAL | Status: DC
  Administered 2022-03-13 – 2022-03-17 (×5): 3
  Filled 2022-03-12 (×6): qty 3

## 2022-03-12 MED ORDER — CARBIDOPA-LEVODOPA 25-100 MG PO TABS
2.0000 | ORAL_TABLET | Freq: Every day | ORAL | Status: DC
  Filled 2022-03-12 (×2): qty 2

## 2022-03-12 MED ORDER — CARBIDOPA-LEVODOPA 25-100 MG PO TABS
3.0000 | ORAL_TABLET | Freq: Every morning | ORAL | Status: DC
  Filled 2022-03-12: qty 3

## 2022-03-12 MED ORDER — THIAMINE HCL 100 MG/ML IJ SOLN
100.0000 mg | Freq: Every day | INTRAMUSCULAR | Status: DC
  Administered 2022-03-12 – 2022-03-17 (×6): 100 mg via INTRAVENOUS
  Filled 2022-03-12 (×6): qty 2

## 2022-03-12 MED ORDER — LORAZEPAM 2 MG/ML IJ SOLN
0.5000 mg | Freq: Four times a day (QID) | INTRAMUSCULAR | Status: DC | PRN
  Administered 2022-03-12 – 2022-03-14 (×3): 0.5 mg via INTRAVENOUS
  Filled 2022-03-12 (×3): qty 1

## 2022-03-12 MED ORDER — CARBIDOPA-LEVODOPA 25-100 MG PO TABS
1.0000 | ORAL_TABLET | Freq: Two times a day (BID) | ORAL | Status: DC
  Filled 2022-03-12 (×2): qty 1

## 2022-03-12 MED ORDER — VITAMIN B-12 100 MCG PO TABS: 500.0000 ug | ORAL_TABLET | Freq: Every day | ORAL | Status: DC

## 2022-03-12 MED ORDER — VITAMIN B-12 1000 MCG PO TABS
1000.0000 ug | ORAL_TABLET | Freq: Every day | ORAL | Status: DC
  Administered 2022-03-12 – 2022-03-17 (×6): 1000 ug
  Filled 2022-03-12 (×6): qty 1

## 2022-03-12 MED ORDER — CLOPIDOGREL BISULFATE 75 MG PO TABS
75.0000 mg | ORAL_TABLET | Freq: Every day | ORAL | Status: DC
  Administered 2022-03-13 – 2022-03-17 (×5): 75 mg
  Filled 2022-03-12 (×5): qty 1

## 2022-03-12 MED ORDER — ATORVASTATIN CALCIUM 40 MG PO TABS
40.0000 mg | ORAL_TABLET | Freq: Every day | ORAL | Status: DC
  Administered 2022-03-13 – 2022-03-17 (×5): 40 mg
  Filled 2022-03-12 (×5): qty 1

## 2022-03-12 MED ORDER — CARBIDOPA-LEVODOPA 25-100 MG PO TABS
1.0000 | ORAL_TABLET | Freq: Two times a day (BID) | ORAL | Status: DC
  Administered 2022-03-12 – 2022-03-17 (×10): 1
  Filled 2022-03-12 (×12): qty 1

## 2022-03-12 NOTE — Evaluation (Signed)
Clinical/Bedside Swallow Evaluation Patient Details  Name: Thomas Huber MRN: [id number] Date of Birth: 1956-03-10  Today's Date: 03/12/2022 Time: SLP Start Time (ACUTE ONLY): 1245 SLP Stop Time (ACUTE ONLY): 1310 SLP Time Calculation (min) (ACUTE ONLY): 25 min  Past Medical History:  Past Medical History:  Diagnosis Date   Parkinson disease    Tremor    Past Surgical History: History reviewed. No pertinent surgical history. HPI:  66 year old male with a history of Parkinson's disease presenting when he was found unresponsive in his apartment.  The last known well time was unclear.  History was unobtainable because of the patient's encephalopathy.  History is obtained from review of the medical record.  Apparently, EMS noted the patient to have facial droop, slurred speech, left arm weakness.  On 03/11/2022 after arrival, the patient eventually became agitated and required sedation with IV Ativan.  An NG tube was subsequently placed because of his dysphagia.  Neurology was consulted.  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 BSE ordered.    Assessment / Plan / Recommendation  Clinical Impression  Clinical swallow evaluation completed with Pt while seated up in chair. Pt with NG in place for medications only at this time. Pt alert, but speech is mostly unintelligible (mumbling). He is followed limited directions related to oral motor examination and required frequent cues and redirection to task. He was assessed with ice chips, water via tsp/cup/straw, and puree. Pt with poor oral awareness, reduced labial seal, and limited lingual movement resulting in labial spillage, oral holding, and suspected delay in swallow trigger. Pt was unable to suck from the straw. He swallowed a few sips of water with delayed throat clearing/cough and a few bites of applesauce. Pt became more restless throughout the visit and eventually the  applesauce had to be removed with a toothette from oral cavity. Suspect that Pt's primary limitation to PO intake will be alertness and attention to task. Recommend NPO with oral care and offer single ice chips and small sips of water if Pt requests and medications via NG with SLP checking back tomorrow AM. Above to medical team. SLP Visit Diagnosis: Dysphagia, unspecified (R13.10)    Aspiration Risk  Moderate aspiration risk;Risk for inadequate nutrition/hydration    Diet Recommendation Ice chips PRN after oral care;NPO (Pt has NG for medications)   Medication Administration: Via alternative means Supervision: Staff to assist with self feeding;Full supervision/cueing for compensatory strategies    Other  Recommendations Oral Care Recommendations: Oral care prior to ice chip/H20;Staff/trained caregiver to provide oral care    Recommendations for follow up therapy are one component of a multi-disciplinary discharge planning process, led by the attending physician.  Recommendations may be updated based on patient status, additional functional criteria and insurance authorization.  Follow up Recommendations Acute inpatient rehab (3hours/day)      Assistance Recommended at Discharge    Functional Status Assessment Patient has had a recent decline in their functional status and demonstrates the ability to make significant improvements in function in a reasonable and predictable amount of time.  Frequency and Duration min 2x/week  1 week       Prognosis Prognosis for Safe Diet Advancement: Fair Barriers to Reach Goals: Cognitive deficits;Behavior;Medication      Swallow Study   General Date of Onset: 03-11-2022 HPI: 66 year old male with a history of Parkinson's disease presenting when he was found unresponsive in his apartment.  The last known  well time was unclear.  History was unobtainable because of the patient's encephalopathy.  History is obtained from review of the medical record.   Apparently, EMS noted the patient to have facial droop, slurred speech, left arm weakness.  On 03/11/2022 after arrival, the patient eventually became agitated and required sedation with IV Ativan.  An NG tube was subsequently placed because of his dysphagia.  Neurology was consulted.  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 BSE ordered. Type of Study: Bedside Swallow Evaluation Previous Swallow Assessment: N/A Diet Prior to this Study: NPO Temperature Spikes Noted: Yes Respiratory Status: Room air History of Recent Intubation: No Behavior/Cognition: Alert;Cooperative;Confused;Agitated;Requires cueing;Doesn't follow directions Oral Cavity Assessment: Within Functional Limits Oral Care Completed by SLP: Yes Oral Cavity - Dentition: Missing dentition;Poor condition Vision: Functional for self-feeding Self-Feeding Abilities: Total assist Patient Positioning: Upright in chair Baseline Vocal Quality: Normal (mumbling) Volitional Cough: Cognitively unable to elicit Volitional Swallow: Unable to elicit    Oral/Motor/Sensory Function Overall Oral Motor/Sensory Function: Generalized oral weakness   Ice Chips Ice chips: Impaired Presentation: Spoon Oral Phase Impairments: Reduced labial seal;Reduced lingual movement/coordination;Poor awareness of bolus Oral Phase Functional Implications: Oral holding Pharyngeal Phase Impairments: Suspected delayed Swallow   Thin Liquid Thin Liquid: Impaired Presentation: Cup;Straw;Spoon Oral Phase Impairments: Reduced labial seal;Reduced lingual movement/coordination;Poor awareness of bolus Oral Phase Functional Implications: Right anterior spillage;Prolonged oral transit;Oral holding Pharyngeal  Phase Impairments: Suspected delayed Swallow;Decreased hyoid-laryngeal movement;Cough - Delayed;Throat Clearing - Delayed    Nectar Thick Nectar Thick Liquid: Not tested   Honey Thick Honey Thick  Liquid: Not tested   Puree Puree: Impaired Presentation: Spoon Oral Phase Impairments: Reduced labial seal;Reduced lingual movement/coordination;Poor awareness of bolus Oral Phase Functional Implications: Right anterior spillage;Prolonged oral transit;Oral residue;Oral holding Pharyngeal Phase Impairments: Suspected delayed Swallow   Solid     Solid: Not tested     Thank you,  Havery Moros, CCC-SLP [phone]  Sanja Elizardo 03/12/2022,4:49 PM

## 2022-03-12 NOTE — Hospital Course (Addendum)
Mr. Bulnes is a 66 year old male with a history of Parkinson's disease who presented when he was found unresponsive in his apartment.  The last known well time was unclear.  History was unobtainable because of the patient's encephalopathy. Apparently, EMS noted the patient to have facial droop, slurred speech, left arm weakness.  On 03/11/2022 after arrival, the patient eventually became agitated and required sedation with IV Ativan.  An NG tube was subsequently placed because of his dysphagia.  Neurology was consulted.  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 Notably, the patient did have a long history of alcohol dependence previously, but quit slightly over 1 year prior to this admission. Due to unavailable family initially there was a DSS request for interm guardianship. After this, the patient's cousin was able to be reached and became involved in patient's care/decision making.

## 2022-03-12 NOTE — Plan of Care (Signed)
  Problem: Acute Rehab OT Goals (only OT should resolve) Goal: Pt. Will Perform Grooming Flowsheets (Taken 03/12/2022 0948) Pt Will Perform Grooming:  with min guard assist  standing Goal: Pt. Will Perform Upper Body Bathing Flowsheets (Taken 03/12/2022 0948) Pt Will Perform Upper Body Bathing:  with modified independence  sitting Goal: Pt. Will Perform Lower Body Bathing Flowsheets (Taken 03/12/2022 0948) Pt Will Perform Lower Body Bathing:  with min assist  sitting/lateral leans Goal: Pt. Will Perform Upper Body Dressing Flowsheets (Taken 03/12/2022 0948) Pt Will Perform Upper Body Dressing:  with modified independence  sitting Goal: Pt. Will Perform Lower Body Dressing Flowsheets (Taken 03/12/2022 0948) Pt Will Perform Lower Body Dressing:  with min assist  sitting/lateral leans Goal: Pt. Will Transfer To Toilet Flowsheets (Taken 03/12/2022 [phone]) Pt Will Transfer to Toilet:  with min guard assist  with supervision  ambulating Goal: Pt. Will Perform Toileting-Clothing Manipulation Flowsheets (Taken 03/12/2022 0948) Pt Will Perform Toileting - Clothing Manipulation and hygiene:  with supervision  sitting/lateral leans Goal: Pt/Caregiver Will Perform Home Exercise Program Flowsheets (Taken 03/12/2022 [phone]) Pt/caregiver will Perform Home Exercise Program:  Increased ROM  Increased strength  Both right and left upper extremity  With Supervision  Loyce Flaming OT, MOT

## 2022-03-12 NOTE — Progress Notes (Signed)
PROGRESS NOTE  Sajad Glander QIW:[id number] DOB: 18-May-1955 DOA: 02/23/2022 PCP: Ignatius Specking, MD  Brief History:  66 year old male with a history of Parkinson's disease presenting when he was found unresponsive in his apartment.  The last known well time was unclear.  History was unobtainable because of the patient's encephalopathy.  History is obtained from review of the medical record.  Apparently, EMS noted the patient to have facial droop, slurred speech, left arm weakness.  On 03/11/2022 after arrival, the patient eventually became agitated and required sedation with IV Ativan.  An NG tube was subsequently placed because of his dysphagia.  Neurology was consulted.  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 In the ED, the patient has been afebrile and hemodynamically stable with oxygen saturation 95-90% room air.  WBC 9.0, hemoglobin 15.0, platelets 1 49,000.  Sodium 139, potassium 3.7, bicarbonate 25, serum creatinine 0.81.  LFTs were unremarkable. Notably, the patient did have a long history of alcohol dependence previously, but quit slightly over 1 year prior to this admission.   Assessment/Plan: Acute ischemic stroke with cytotoxic edema -Appreciate Neurology Consult -PT/OT evaluation -Speech therapy eval -CT brain-- -MRI brain--acute right MCA infarct with cytotoxic edema -MRA brain--right MCA M2 occlusion -Carotid Duplex--no hemodynamically significant stenosis -Echo--EF 60-65%, no WMA, grade 1 DD, no PFO -LDL--85 -HbA1C--5.2 -Antiplatelet--ASA 81 + Plavix 75 mg x 3 weeks, then ASA 81 alone  Acute metabolic encephalopathy -Secondary to ischemic stroke with cytotoxic edema and B12 deficiency -UA negative for pyuria -B12 --109>> supplementing -Folic acid 8.5 -Ammonia 24 -Check TSH  Dyslipidemia -Continue statin -LDL 85  Parkinson's disease -Restart Sinemet  Alcohol dependence in  remission -Start thiamine  Dysphagia -NG inserted -speech eval -continue IVF      Family Communication:  no Family at bedside  Consultants:  neurology  Code Status:  FULL   DVT Prophylaxis:  [place] Heparin   Procedures: As Listed in Progress Note Above  Antibiotics: None      Subjective: Able to answer simple yes/no questions.  Denies any chest pain, shortness breath, abdominal pain, nausea, vomiting  Objective: Vitals:   03/12/22 0507 03/12/22 0530 03/12/22 0600 03/12/22 0630  BP:  (!) 147/64 (!) 150/81 (!) 171/77  Pulse:  72 74 79  Resp:  15 18 (!) 21  Temp: 97.6 F (36.4 C)     TempSrc: Axillary     SpO2:  95% 94% 95%  Weight:      Height:        Intake/Output Summary (Last 24 hours) at 03/12/2022 0737 Last data filed at 03/11/2022 2355 Gross per 24 hour  Intake 623.66 ml  Output 350 ml  Net 273.66 ml   Weight change:  Exam:  General:  Pt is alert, follows commands appropriately, not in acute distress HEENT: No icterus, No thrush, No neck mass, [place]/AT Cardiovascular: RRR, S1/S2, no rubs, no gallops Respiratory: Bibasilar rales.  No wheezing.  Rhonchi Abdomen: Soft/+BS, non tender, non distended, no guarding Extremities: No edema, No lymphangitis, No petechiae, No rashes, no synovitis Neuro:  +right face droop, strength 4/5 in RUE, RLE, strength 4-/5 LUE, LLE; sensation intact bilateral; no dysmetria; babinski equivocal    Data Reviewed: I have personally reviewed following labs and imaging studies Basic Metabolic Panel: Recent Labs  Lab 02/25/2022 2258 03/15/2022 2320 03/11/22 0616 03/12/22 0534  NA 141 138 139 140  K 3.9 3.7 3.7 4.1  CL 103 103 106 107  CO2  --  26 25 26   GLUCOSE 96 99 118* 114*  BUN 9 11 11 10   CREATININE 1.10 1.09 0.81 0.93  CALCIUM  --  8.9 9.0 9.0  MG  --   --  2.1  --   PHOS  --   --  2.8  --    Liver Function Tests: Recent Labs  Lab 2022/03/17 2320 03/11/22 0616  AST 17 16  ALT 10 12  ALKPHOS 63 61   BILITOT 1.0 1.2  PROT 7.5 7.0  ALBUMIN 4.2 3.8   No results for input(s): "LIPASE", "AMYLASE" in the last 168 hours. Recent Labs  Lab 03/11/22 1033  AMMONIA 24   Coagulation Profile: Recent Labs  Lab 03/17/2022 2320  INR 1.0   CBC: Recent Labs  Lab 03-17-2022 2258 2022-03-17 2320 03/11/22 0616 03/12/22 0534  WBC  --  9.2 9.0 9.0  NEUTROABS  --  6.3  --   --   HGB 15.0 14.3 15.0 15.0  HCT 44.0 41.7 43.8 44.2  MCV  --  90.1 89.8 90.8  PLT  --  176 170 149*   Cardiac Enzymes: No results for input(s): "CKTOTAL", "CKMB", "CKMBINDEX", "TROPONINI" in the last 168 hours. BNP: Invalid input(s): "POCBNP" CBG: Recent Labs  Lab 17-Mar-2022 2252  GLUCAP 92   HbA1C: Recent Labs    03/11/22 0616  HGBA1C 5.2   Urine analysis:    Component Value Date/Time   COLORURINE YELLOW 03/11/2022 0822   APPEARANCEUR CLEAR 03/11/2022 0822   LABSPEC 1.014 03/11/2022 0822   PHURINE 7.0 03/11/2022 0822   GLUCOSEU NEGATIVE 03/11/2022 0822   HGBUR SMALL (A) 03/11/2022 0822   BILIRUBINUR NEGATIVE 03/11/2022 0822   KETONESUR 5 (A) 03/11/2022 0822   PROTEINUR NEGATIVE 03/11/2022 0822   NITRITE NEGATIVE 03/11/2022 0822   LEUKOCYTESUR NEGATIVE 03/11/2022 0822   Sepsis Labs: @LABRCNTIP (procalcitonin:4,lacticidven:4) ) Recent Results (from the past 240 hour(s))  Resp panel by RT-PCR (RSV, Flu A&B, Covid) Anterior Nasal Swab     Status: None   Collection Time: March 17, 2022 11:20 PM   Specimen: Anterior Nasal Swab  Result Value Ref Range Status   SARS Coronavirus 2 by RT PCR NEGATIVE NEGATIVE Final    Comment: (NOTE) SARS-CoV-2 target nucleic acids are NOT DETECTED.  The SARS-CoV-2 RNA is generally detectable in upper respiratory specimens during the acute phase of infection. The lowest concentration of SARS-CoV-2 viral copies this assay can detect is 138 copies/mL. A negative result does not preclude SARS-Cov-2 infection and should not be used as the sole basis for treatment or other patient  management decisions. A negative result may occur with  improper specimen collection/handling, submission of specimen other than nasopharyngeal swab, presence of viral mutation(s) within the areas targeted by this assay, and inadequate number of viral copies(<138 copies/mL). A negative result must be combined with clinical observations, patient history, and epidemiological information. The expected result is Negative.  Fact Sheet for Patients:  03/13/2022  Fact Sheet for Healthcare Providers:   This test is no t yet approved or cleared by the 03/12/22 FDA and  has been authorized for detection and/or diagnosis of SARS-CoV-2 by FDA under an Emergency Use Authorization (EUA). This EUA will remain  in effect (meaning this test can be used) for the duration of the COVID-19 declaration under Section 564(b)(1) of the Act, 21 U.S.C.section 360bbb-3(b)(1), unless the authorization is terminated  or revoked sooner.  Influenza A by PCR NEGATIVE NEGATIVE Final   Influenza B by PCR NEGATIVE NEGATIVE Final    Comment: (NOTE) The Xpert Xpress SARS-CoV-2/FLU/RSV plus assay is intended as an aid in the diagnosis of influenza from Nasopharyngeal swab specimens and should not be used as a sole basis for treatment. Nasal washings and aspirates are unacceptable for Xpert Xpress SARS-CoV-2/FLU/RSV testing.  Fact Sheet for Patients: BloggerCourse.com  Fact Sheet for Healthcare Providers: SeriousBroker.it  This test is not yet approved or cleared by the [place] FDA and has been authorized for detection and/or diagnosis of SARS-CoV-2 by FDA under an Emergency Use Authorization (EUA). This EUA will remain in effect (meaning this test can be used) for the duration of the COVID-19 declaration under Section 564(b)(1) of the Act, 21 U.S.C. section 360bbb-3(b)(1),  unless the authorization is terminated or revoked.     Resp Syncytial Virus by PCR NEGATIVE NEGATIVE Final    Comment: (NOTE) Fact Sheet for Patients: BloggerCourse.com  Fact Sheet for Healthcare Providers: SeriousBroker.it  This test is not yet approved or cleared by the [place] FDA and has been authorized for detection and/or diagnosis of SARS-CoV-2 by FDA under an Emergency Use Authorization (EUA). This EUA will remain in effect (meaning this test can be used) for the duration of the COVID-19 declaration under Section 564(b)(1) of the Act, 21 U.S.C. section 360bbb-3(b)(1), unless the authorization is terminated or revoked.  Performed at [facility], [address] [place], [place] [address]      Scheduled Meds:   stroke: early stages of recovery book   Does not apply Once   aspirin  300 mg Rectal Daily   atorvastatin  40 mg Oral Daily   carbidopa-levodopa  3 tablet Oral q morning   And   carbidopa-levodopa  2 tablet Oral Q1200   And   carbidopa-levodopa  1 tablet Oral BID   clopidogrel  75 mg Oral Daily   cyanocobalamin  1,000 mcg Intramuscular Daily   Continuous Infusions:  dextrose 5 % and 0.9% NaCl 75 mL/hr at 03/12/22 0505    Procedures/Studies: ECHOCARDIOGRAM COMPLETE  Result Date: 03/11/2022    ECHOCARDIOGRAM REPORT   Patient Name:   NORMA IGNASIAK Date of Exam: 03/11/2022 Medical Rec #:  [id number]     Height:       67.0 in Accession #:    [id number]    Weight:       190.0 lb Date of Birth:  [birth year]     BSA:          1.979 m Patient Age:    66 years      BP:           146/65 mmHg Patient Gender: M             HR:           70 bpm. [facility] Procedure: 2D Echo, Cardiac Doppler and Color Doppler Indications:    Stroke I63.9  History:        Patient has no prior history of Echocardiogram examinations.                 Stroke. Parkinson's disease.  Sonographer:    Celesta Gentile RCS Referring  Phys: [id number] OLADAPO ADEFESO  Sonographer Comments: Technically difficult study due to patient not able to understand instructions and very difficult imaging windows. Could NOT image IVC or subcostal. IMPRESSIONS  1. Left ventricular ejection fraction, by estimation, is 60 to 65%. The left  ventricle has normal function. The left ventricle has no regional wall motion abnormalities. Left ventricular diastolic parameters are consistent with Grade I diastolic dysfunction (impaired relaxation).  2. Right ventricular systolic function poorly visualized but appears to be normal. The right ventricular size is normal.  3. The mitral valve was not well visualized. No evidence of mitral valve regurgitation. No evidence of mitral stenosis.  4. The aortic valve is tricuspid. Aortic valve regurgitation is not visualized. No aortic stenosis is present. Comparison(s): No prior Echocardiogram. FINDINGS  Left Ventricle: Left ventricular ejection fraction, by estimation, is 60 to 65%. The left ventricle has normal function. The left ventricle has no regional wall motion abnormalities. Definity contrast agent was given IV to delineate the left ventricular  endocardial borders. The left ventricular internal cavity size was normal in size. There is no left ventricular hypertrophy. Left ventricular diastolic parameters are consistent with Grade I diastolic dysfunction (impaired relaxation). Right Ventricle: The right ventricular size is normal. Right vetricular wall thickness was not well visualized. Right ventricular systolic function poorly visualized but appears to be normal. Left Atrium: Left atrial size was normal in size. Right Atrium: Right atrial size was normal in size. Pericardium: There is no evidence of pericardial effusion. Mitral Valve: The mitral valve was not well visualized. No evidence of mitral valve regurgitation. No evidence of mitral valve stenosis. Tricuspid Valve: The tricuspid valve is not well visualized.  Tricuspid valve regurgitation is not demonstrated. No evidence of tricuspid stenosis. Aortic Valve: The aortic valve is tricuspid. Aortic valve regurgitation is not visualized. No aortic stenosis is present. Pulmonic Valve: The pulmonic valve was not well visualized. Pulmonic valve regurgitation is not visualized. No evidence of pulmonic stenosis. Aorta: The aortic root is normal in size and structure. Venous: The inferior vena cava was not well visualized. IAS/Shunts: The interatrial septum was not assessed.  LEFT VENTRICLE PLAX 2D LVIDd:         4.10 cm   Diastology LVIDs:         2.40 cm   LV e' medial:    5.44 cm/s LV PW:         1.00 cm   LV E/e' medial:  10.2 LV IVS:        1.00 cm   LV e' lateral:   8.38 cm/s LVOT diam:     1.90 cm   LV E/e' lateral: 6.6 LV SV:         58 LV SV Index:   30 LVOT Area:     2.84 cm  RIGHT VENTRICLE TAPSE (M-mode): 2.1 cm LEFT ATRIUM           Index LA diam:      4.10 cm 2.07 cm/m LA Vol (A2C): 39.2 ml 19.81 ml/m LA Vol (A4C): 48.4 ml 24.46 ml/m  AORTIC VALVE LVOT Vmax:   104.00 cm/s LVOT Vmean:  68.200 cm/s LVOT VTI:    0.206 m  AORTA Ao Root diam: 3.00 cm MITRAL VALVE MV Area (PHT): 2.50 cm    SHUNTS MV Decel Time: 303 msec    Systemic VTI:  0.21 m MV E velocity: 55.70 cm/s  Systemic Diam: 1.90 cm MV A velocity: 87.80 cm/s MV E/A ratio:  0.63 Vishnu Priya Mallipeddi Electronically signed by Winfield Rast Mallipeddi Signature Date/Time: 03/11/2022/3:26:57 PM    Final    DG Abdomen 1 View  Result Date: 03/11/2022 CLINICAL DATA:  NG tube placement EXAM: ABDOMEN - 1 VIEW COMPARISON:  None Available. FINDINGS: Tip of enteric  tube is seen in the region of distal antrum of the stomach. There is presence of gas in few nondilated small bowel loops in upper abdomen. Lower abdomen and pelvis are not included in the image. IMPRESSION: Tip of enteric tube is seen in the distal antrum of the stomach. Electronically Signed   By: Ernie AvenaPalani  Rathinasamy M.D.   On: 03/11/2022 13:15   US  Carotid Bilateral  Result Date: 03/11/2022 CLINICAL DATA:  Acute right MCA territory cerebral infarction. EXAM: BILATERAL CAROTID DUPLEX ULTRASOUND TECHNIQUE: Wallace CullensGray scale imaging, color Doppler and duplex ultrasound were performed of bilateral carotid and vertebral arteries in the neck. COMPARISON:  None Available. FINDINGS: Criteria: Quantification of carotid stenosis is based on velocity parameters that correlate the residual internal carotid diameter with NASCET-based stenosis levels, using the diameter of the distal internal carotid lumen as the denominator for stenosis measurement. The following velocity measurements were obtained: RIGHT ICA:  58/16 cm/sec CCA:  83/11 cm/sec SYSTOLIC ICA/CCA RATIO:  0.7 ECA:  112 cm/sec LEFT ICA:  62/17 cm/sec CCA:  97/10 cm/sec SYSTOLIC ICA/CCA RATIO:  0.6 ECA:  93 cm/sec RIGHT CAROTID ARTERY: Mild amount of partially calcified plaque at the level of the carotid bulb and proximal right ICA. Estimated right ICA stenosis is less than 50%. RIGHT VERTEBRAL ARTERY: Antegrade flow with normal waveform and velocity. LEFT CAROTID ARTERY: Minimal partially calcified plaque at the level of the left carotid bulb and proximal ICA. Estimated left ICA stenosis is less than 50% LEFT VERTEBRAL ARTERY: Antegrade flow with normal waveform and velocity. IMPRESSION: Mild amount of plaque at the level of both carotid bulbs and proximal internal carotid arteries, right greater than left. No significant carotid stenosis identified. Estimated bilateral ICA stenoses are less than 50%. Electronically Signed   By: Irish LackGlenn  Yamagata M.D.   On: 03/11/2022 11:27   MR BRAIN WO CONTRAST  Addendum Date: 03/11/2022   ADDENDUM REPORT: 03/11/2022 10:53 ADDENDUM: Study discussed with Dr. Tanda RockersSamuel Gray in the ED by telephone on 03/11/2022 at 10:44 . Electronically Signed   By: Odessa FlemingH  Hall M.D.   On: 03/11/2022 10:53   Result Date: 03/11/2022 CLINICAL DATA:  66 year old male altered mental status. Neurologic  deficit. EXAM: MRI HEAD WITHOUT CONTRAST TECHNIQUE: Multiplanar, multiecho pulse sequences of the brain and surrounding structures were obtained without intravenous contrast. COMPARISON:  Head CT yesterday. FINDINGS: Brain: Confluent restricted diffusion in the right insula and frontal operculum in an area of about 3.5 cm (series 5, image 20), tracking cephalad to toward the superior frontal gyrus. T2 and FLAIR hyperintense Cytotoxic edema. No hemorrhagic transformation. No mass effect. No contralateral left hemisphere or posterior fossa restricted diffusion. No midline shift, mass effect, evidence of mass lesion, ventriculomegaly, extra-axial collection or acute intracranial hemorrhage. Cervicomedullary junction and pituitary are within normal limits. Outside of the 8 acutely affected parenchyma gray and white matter signal is largely normal for age. Tiny chronic infarct in the right cerebellum series 15, image 1. Probable tiny chronic lacunar infarct right caudate nucleus series 18, image 29. Vascular: Major intracranial vascular flow voids are preserved. See MRA today reported separately. Skull and upper cervical spine: Negative. Visualized bone marrow signal is within normal limits. Orbits/sinuses: Negative orbits. Paranasal sinuses and mastoids are stable and well aerated. Other: Negative visible scalp and face. IMPRESSION: 1. Positive for acute Right MCA territory infarct, middle sylvian division. Cytotoxic edema with no hemorrhage or mass effect. 2. Minimal underlying chronic small vessel disease. 3. MRA reported separately. Electronically Signed: By: Odessa FlemingH  Hall  M.D. On: 03/11/2022 10:38   MR ANGIO HEAD WO CONTRAST  Result Date: 03/11/2022 CLINICAL DATA:  66 year old male altered mental status. Neurologic deficit. Right MCA infarct. EXAM: MRA HEAD WITHOUT CONTRAST TECHNIQUE: Angiographic images of the Circle of Willis were acquired using MRA technique without intravenous contrast. COMPARISON:  Head CT  yesterday. Brain MRI today reported separately. FINDINGS: Anterior circulation: Intermittent mild motion artifact. Antegrade flow in both ICA siphons to the carotid termini. Patent MCA and ACA origins. Mild to moderate bilateral supraclinoid ICA stenosis suspected. Anterior communicating artery and visible ACA branches are within normal limits, the right ACA A2 might be dominant. Left MCA M1 segment and bifurcation are patent. Right MCA M1 segment is patent, but there is occlusion of the M2 middle sylvian branch at its origin at the bifurcation on series 10, image 106. The posterior M2 branch remains patent. Posterior circulation: Antegrade flow in the posterior circulation, the distal right vertebral artery appears dominant and the left appears to terminates in PICA. No visible distal vertebral stenosis. Patent basilar artery without stenosis. Patent basilar tip, SCA and PCA origins. Posterior communicating arteries are diminutive or absent. Motion artifact of the affects bilateral PCA branch detail, proximal PCAs are patent. Anatomic variants: Dominant right vertebral artery appears to supply the basilar. Right ACA A2 may be dominant. Other: MRI head at the same time is reported separately. IMPRESSION: 1. Positive for Right MCA M2 Occlusion: middle and/or anterior division branch origin is occluded at the MCA bifurcation. 2. 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 Study discussed with Dr. Tanda Rockers in the ED by telephone on 03/11/2022 at 10:44 . Electronically Signed   By: Odessa Fleming M.D.   On: 03/11/2022 10:53   DG Chest Port 1 View  Result Date: 03/11/2022 CLINICAL DATA:  Weakness EXAM: PORTABLE CHEST 1 VIEW COMPARISON:  None Available. FINDINGS: Lungs are clear.  No pleural effusion or pneumothorax. The heart is normal in size. IMPRESSION: No evidence of acute cardiopulmonary disease.  Electronically Signed   By: Charline Bills M.D.   On: 03/11/2022 00:11   CT CERVICAL SPINE WO CONTRAST  Result Date: 03/11/2022 CLINICAL DATA:  Unresponsive EXAM: CT CERVICAL SPINE WITHOUT CONTRAST TECHNIQUE: Multidetector CT imaging of the cervical spine was performed without intravenous contrast. Multiplanar CT image reconstructions were also generated. RADIATION DOSE REDUCTION: This exam was performed according to the departmental dose-optimization program which includes automated exposure control, adjustment of the mA and/or kV according to patient size and/or use of iterative reconstruction technique. COMPARISON:  None Available. FINDINGS: Alignment: Normal cervical lordosis. Skull base and vertebrae: No acute fracture. No primary bone lesion or focal pathologic process. Soft tissues and spinal canal: No prevertebral fluid or swelling. No visible canal hematoma. Disc levels: Mild degenerative changes of the mid/lower cervical spine. Spinal canal is patent. Upper chest: Visualized lung apices are clear. Other: Visualized thyroid is unremarkable. IMPRESSION: No traumatic injury of the cervical spine. Mild degenerative changes. Electronically Signed   By: Charline Bills M.D.   On: 03/11/2022 00:08   CT HEAD WO CONTRAST  Result Date: 03/11/2022 CLINICAL DATA:  Delirium. EXAM: CT HEAD WITHOUT CONTRAST TECHNIQUE: Contiguous axial images were obtained from the base of the skull through the vertex without intravenous contrast. RADIATION DOSE REDUCTION: This exam was performed according to the departmental dose-optimization program which includes automated exposure control, adjustment of the mA and/or kV according to patient size and/or use  of iterative reconstruction technique. COMPARISON:  Head CT 03/18/2019 FINDINGS: Brain: No evidence of acute infarction, hemorrhage, hydrocephalus, extra-axial collection or mass lesion/mass effect. Vascular: No hyperdense vessel or unexpected calcification. Skull:  Normal. Negative for fracture or focal lesion. Sinuses/Orbits: Patient is status post sinonasal surgery on the left. Mastoid air cells are clear. Chronic nasal bone fractures are present. Orbits are within normal limits. Other: None. IMPRESSION: No acute intracranial abnormality. Electronically Signed   By: Darliss Cheney M.D.   On: 03/11/2022 00:07    Catarina Hartshorn, DO  [facility]  If 7PM-7AM, please contact night-coverage [email or web address] Password [email or web address] 03/12/2022, 7:37 AM   LOS: 1 day

## 2022-03-12 NOTE — Progress Notes (Signed)
Inpatient Rehab Admissions Coordinator:   Consult received and chart reviewed.  Contact number in chart [phone]) no longer in service and removed for accuracy.  Cannot proceed with CIR if pt does not have caregiver support.  If a caregiver can be identified, who is willing to provide 24/7 assistance for mobility and ADLs, and contact info is verified we will assess.  Otherwise recommend SNF.    Estill Dooms, PT, DPT Admissions Coordinator [phone] 03/12/22  4:37 PM

## 2022-03-12 NOTE — Progress Notes (Signed)
Pt arrived to room 312 via stretcher from ED. Pt restless, in soft wrist restraints x2 and with mittens on each hand. Pt moved by staff to bed. Pt with  mumbling speech, able to tell me his name and birthday, unable to tell me place or reason for being at hospital. When I told pt that he was here for a stroke he said, "I had a stroke three days ago. My friend came over and found me and got help." Pt able to state that he lives alone and that he currently has no pain. Pt follows commands without difficulty. Wrist restraints removed. Mittens replace due to patient pulling at NGT in his nose.  Bed alarm on for safety, call bell within reach. Pt oriented to room and safety precautions but unable to repeat information or state that he understood what he was told.

## 2022-03-12 NOTE — Evaluation (Signed)
Physical Therapy Evaluation Patient Details Name: Thomas Huber MRN: [id number] DOB: [birth year] Today's Date: 03/12/2022  History of Present Illness  Thomas Huber is a 66 y.o. male with medical history significant of Parkinson disease who presents to the emergency department via EMS due to possible stroke.  Patient was alert and oriented x 3, but was unable to provide history of why he came to the ED.  History was obtained from ED physician and ED medical record.  Per report, he was found unresponsive in his apartment complex last known well was unknown.  EMS was activated and arrival of EMS team, blood glucose check was greater than 100, patient was reported to have facial droop slurred speech and left arm weakness.  Patient has no known history of seizures.   Clinical Impression  Patient presents alert, restless and slightly impulsive requiring repeated verbal/tactile cueing for following instructions.  Patient demonstrates labored movement for sitting up at bedside, very unsteady on feet requiring use of RW for safety, able to ambulate in hallway with slow labored cadence, decreased step/stride and fair/poor carryover for heel to toe stepping.  Patient put back to bed after therapy due to fall risk.  Patient will benefit from continued skilled physical therapy in hospital and recommended venue below to increase strength, balance, endurance for safe ADLs and gait.        Recommendations for follow up therapy are one component of a multi-disciplinary discharge planning process, led by the attending physician.  Recommendations may be updated based on patient status, additional functional criteria and insurance authorization.  Follow Up Recommendations Acute inpatient rehab (3hours/day)      Assistance Recommended at Discharge Intermittent Supervision/Assistance  Patient can return home with the following  A lot of help with bathing/dressing/bathroom;A lot of help with walking and/or  transfers;Help with stairs or ramp for entrance;Assistance with cooking/housework    Equipment Recommendations Rolling walker (2 wheels)  Recommendations for Other Services       Functional Status Assessment Patient has had a recent decline in their functional status and demonstrates the ability to make significant improvements in function in a reasonable and predictable amount of time.     Precautions / Restrictions Precautions Precautions: Fall Restrictions Weight Bearing Restrictions: No      Mobility  Bed Mobility Overal bed mobility: Needs Assistance Bed Mobility: Supine to Sit, Sit to Supine     Supine to sit: Min guard, HOB elevated Sit to supine: Mod assist, Max assist   General bed mobility comments: increased time, labored movement    Transfers Overall transfer level: Needs assistance Equipment used: Rolling walker (2 wheels) Transfers: Sit to/from Stand, Bed to chair/wheelchair/BSC Sit to Stand: Min assist, Mod assist   Step pivot transfers: Mod assist       General transfer comment: unsteady labored movement, required use of RW for safety    Ambulation/Gait Ambulation/Gait assistance: Min assist, Mod assist Gait Distance (Feet): 40 Feet Assistive device: Rolling walker (2 wheels) Gait Pattern/deviations: Decreased step length - right, Decreased step length - left, Decreased stride length, Decreased dorsiflexion - right, Decreased dorsiflexion - left Gait velocity: decreased     General Gait Details: slow labored cadence with decreased step/stride length, increased time for making turns and limited mostly due to fatigue  Stairs            Wheelchair Mobility    Modified Rankin (Stroke Patients Only)       Balance Overall balance assessment: Needs assistance Sitting-balance support: Feet supported, No  upper extremity supported Sitting balance-Leahy Scale: Fair Sitting balance - Comments: fair seated at EOB   Standing balance support:  Bilateral upper extremity supported, During functional activity Standing balance-Leahy Scale: Fair Standing balance comment: fair/poor using RW                             Pertinent Vitals/Pain Pain Assessment Pain Assessment: Faces Faces Pain Scale: No hurt    Home Living Family/patient expects to be discharged to:: Private residence Living Arrangements: Alone                 Additional Comments: Patient is poor historian    Prior Function Prior Level of Function : Patient poor historian/Family not available             Mobility Comments: Unsure at this time. ADLs Comments: Unsure at this time.     Hand Dominance   Dominant Hand: Right    Extremity/Trunk Assessment   Upper Extremity Assessment Upper Extremity Assessment: Defer to OT evaluation    Lower Extremity Assessment Lower Extremity Assessment: Generalized weakness    Cervical / Trunk Assessment Cervical / Trunk Assessment: Kyphotic  Communication   Communication: Expressive difficulties;Receptive difficulties  Cognition Arousal/Alertness: Awake/alert Behavior During Therapy: Restless, Impulsive Overall Cognitive Status: No family/caregiver present to determine baseline cognitive functioning                                 General Comments: requires frequent repeated verbal/tactile cueing for following instrucitons        General Comments      Exercises     Assessment/Plan    PT Assessment Patient needs continued PT services  PT Problem List Decreased strength;Decreased activity tolerance;Decreased balance;Decreased mobility       PT Treatment Interventions DME instruction;Gait training;Stair training;Functional mobility training;Therapeutic activities;Therapeutic exercise;Patient/family education;Balance training    PT Goals (Current goals can be found in the Care Plan section)  Acute Rehab PT Goals Patient Stated Goal: return home PT Goal Formulation:  With patient Time For Goal Achievement: 23-Apr-2022 Potential to Achieve Goals: Good    Frequency Min 4X/week     Co-evaluation PT/OT/SLP Co-Evaluation/Treatment: Yes Reason for Co-Treatment: Complexity of the patient's impairments (multi-system involvement);To address functional/ADL transfers PT goals addressed during session: Mobility/safety with mobility;Balance;Proper use of DME OT goals addressed during session: ADL's and self-care       AM-PAC PT "6 Clicks" Mobility  Outcome Measure Help needed turning from your back to your side while in a flat bed without using bedrails?: None Help needed moving from lying on your back to sitting on the side of a flat bed without using bedrails?: A Little Help needed moving to and from a bed to a chair (including a wheelchair)?: A Lot Help needed standing up from a chair using your arms (e.g., wheelchair or bedside chair)?: A Lot Help needed to walk in hospital room?: A Lot Help needed climbing 3-5 steps with a railing? : A Lot 6 Click Score: 15    End of Session   Activity Tolerance: Patient tolerated treatment well;Patient limited by fatigue Patient left: in bed;with call bell/phone within reach Nurse Communication: Mobility status PT Visit Diagnosis: Unsteadiness on feet (R26.81);Other abnormalities of gait and mobility (R26.89);Muscle weakness (generalized) (M62.81)    Time: 1601-0932 PT Time Calculation (min) (ACUTE ONLY): 20 min   Charges:   PT Evaluation $PT Eval  Moderate Complexity: 1 Mod PT Treatments $Therapeutic Activity: 8-22 mins        12:12 PM, 03/12/22 Ocie Bob, MPT Physical Therapist with [facility] [phone] office [phone] mobile phone

## 2022-03-12 NOTE — TOC Initial Note (Signed)
Transition of Care [facility]) - Initial/Assessment Note    Patient Details  Name: Thomas Huber MRN: [id number] Date of Birth: May 11, 1955  [facility]) CM/SW Contact:    Elliot Gault, LCSW Phone Number: 03/12/2022, 2:50 PM  Clinical Narrative:                  Pt admitted with CVA. PT/OT recommending CIR at dc. Awaiting CIR evaluation. Pt currently unable to participate in [facility] assessment as he is not fully oriented.   TOC will follow and will refer for SNF if he ends up not being a CIR candidate.  Expected Discharge Plan: IP Rehab Facility Barriers to Discharge: Continued Medical Work up   Patient Goals and CMS Choice        Expected Discharge Plan and Services Expected Discharge Plan: IP Rehab Facility In-house Referral: Clinical Social Work     Living arrangements for the past 2 months: Apartment                                      Prior Living Arrangements/Services Living arrangements for the past 2 months: Apartment Lives with:: Self Patient language and need for interpreter reviewed:: Yes              Criminal Activity/Legal Involvement Pertinent to Current Situation/Hospitalization: No - Comment as needed  Activities of Daily Living Home Assistive Devices/Equipment: None ADL Screening (condition at time of admission) Patient's cognitive ability adequate to safely complete daily activities?: Yes Is the patient deaf or have difficulty hearing?: No Does the patient have difficulty seeing, even when wearing glasses/contacts?: No Does the patient have difficulty concentrating, remembering, or making decisions?: Yes Patient able to express need for assistance with ADLs?: Yes Does the patient have difficulty dressing or bathing?: Yes Independently performs ADLs?: No Communication: Needs assistance Is this a change from baseline?: Change from baseline, expected to last <3 days Dressing (OT): Needs assistance Is this a change from baseline?:  Change from baseline, expected to last <3days Grooming: Needs assistance Is this a change from baseline?: Change from baseline, expected to last <3 days Feeding: Needs assistance Is this a change from baseline?: Change from baseline, expected to last <3 days Bathing: Needs assistance Is this a change from baseline?: Change from baseline, expected to last <3 days Toileting: Needs assistance Is this a change from baseline?: Change from baseline, expected to last <3 days In/Out Bed: Needs assistance Is this a change from baseline?: Change from baseline, expected to last <3 days Walks in Home: Needs assistance Is this a change from baseline?: Change from baseline, expected to last <3 days Does the patient have difficulty walking or climbing stairs?: Yes Weakness of Legs: Both Weakness of Arms/Hands: None  Permission Sought/Granted                  Emotional Assessment       Orientation: : Oriented to Self Alcohol / Substance Use: Not Applicable Psych Involvement: No (comment)  Admission diagnosis:  TIA (transient ischemic attack) [G45.9] CVA (cerebral vascular accident) (HCC) [I63.9] 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 (HCC) [I63.511] Patient Active Problem List   Diagnosis Date Noted   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 (HCC) 03/11/2022   B12 deficiency 03/11/2022   CVA (cerebral vascular accident) (HCC) 03/11/2022   Parkinson's disease 05/05/2017  PCP:  Ignatius Specking, MD Pharmacy:   [facility] - [place], [place] - [address] [place] [address] Phone: [phone] Fax: [phone]  [facility] - [place], [place] - [address] [facility] AT Portal to Registered [facility] Sites [address] [place] [address] Phone: [phone] Fax: [phone]     Social Determinants of Health (SDOH)  Interventions    Readmission Risk Interventions     No data to display

## 2022-03-12 NOTE — Progress Notes (Signed)
Pt has been aggressive with staff members on multiple occasions throughout shift. Pt has tried to get out of bed by himself several times and has been relocated to a room closer to nurses station. All fall precautions have been applied.

## 2022-03-12 NOTE — ED Notes (Signed)
Pt agitated and trying to crawl out of bed again Pt had NG tube pulled out 4-6 inches, RN advanced it  back in to place Safety Mittens placed on Pt.

## 2022-03-12 NOTE — Evaluation (Signed)
Occupational Therapy Evaluation Patient Details Name: Thomas Huber MRN: [id number] DOB: 12/18/1955 Today's Date: 03/12/2022   History of Present Illness Thomas Huber is a 66 y.o. male with medical history significant of Parkinson disease who presents to the emergency department via EMS due to possible stroke.  Patient was alert and oriented x 3, but was unable to provide history of why he came to the ED.  History was obtained from ED physician and ED medical record.  Per report, he was found unresponsive in his apartment complex last known well was unknown.  EMS was activated and arrival of EMS team, blood glucose check was greater than 100, patient was reported to have facial droop slurred speech and left arm weakness.  Patient has no known history of seizures. (Per DO)   Clinical Impression   Pt awake but seemingly confused needing tactile and verbal cuing to follow commands. Pt baseline status is unknown at this time. UE and vision was difficult to assess due to pt's impaired cognition. Pt was able to slur some words that sounded like his name and something about going home. Pt was able to functionally grasp RW and ambulate in hall and room with moderate assistance. Pt noted to become weaker and with need for more assist when returning to EOB from ambulation in hall. No family or friends present to discuss pt's baseline cognition and function. Pt is generally limited by weakness, decreased coordination, and deficits in cognition related to direction following and communication. Pt was left in bed with bed alarm set. Pt will benefit from continued OT in the hospital and recommended venue below to increase strength, balance, and endurance for safe ADL's.        Recommendations for follow up therapy are one component of a multi-disciplinary discharge planning process, led by the attending physician.  Recommendations may be updated based on patient status, additional functional criteria and insurance  authorization.   Follow Up Recommendations  Acute inpatient rehab (3hours/day)     Assistance Recommended at Discharge Frequent or constant Supervision/Assistance  Patient can return home with the following A lot of help with walking and/or transfers;A lot of help with bathing/dressing/bathroom;Assistance with cooking/housework;Direct supervision/assist for medications management;Assist for transportation;Help with stairs or ramp for entrance;Assistance with feeding    Functional Status Assessment  Patient has had a recent decline in their functional status and demonstrates the ability to make significant improvements in function in a reasonable and predictable amount of time.  Equipment Recommendations  None recommended by OT    Recommendations for Other Services Rehab consult     Precautions / Restrictions Precautions Precautions: Fall Restrictions Weight Bearing Restrictions: No      Mobility Bed Mobility Overal bed mobility: Needs Assistance Bed Mobility: Supine to Sit, Sit to Supine     Supine to sit: Min guard, HOB elevated Sit to supine: Mod assist, Max assist   General bed mobility comments: Pt able to sit up with HOB elevated. To return to supine much assist needed to bring B LE into bed.    Transfers Overall transfer level: Needs assistance Equipment used: Rolling walker (2 wheels) Transfers: Sit to/from Stand, Bed to chair/wheelchair/BSC Sit to Stand: Min assist, Mod assist     Step pivot transfers: Mod assist     General transfer comment: Mod A due to need for tactile and verbal cuing while standing and steping towards head of bed.      Balance Overall balance assessment: Needs assistance Sitting-balance support: Bilateral upper extremity supported,  Feet supported Sitting balance-Leahy Scale: Fair Sitting balance - Comments: fair seated at EOB   Standing balance support: Bilateral upper extremity supported, During functional activity Standing  balance-Leahy Scale: Poor Standing balance comment: poor to fair with RW                           ADL either performed or assessed with clinical judgement   ADL Overall ADL's : Needs assistance/impaired Eating/Feeding: NPO Eating/Feeding Details (indicate cue type and reason): NG tube currently in place. Grooming: Minimal assistance;Sitting;Moderate assistance   Upper Body Bathing: Minimal assistance;Sitting;Moderate assistance   Lower Body Bathing: Maximal assistance;Sitting/lateral leans   Upper Body Dressing : Minimal assistance;Sitting;Moderate assistance   Lower Body Dressing: Maximal assistance;Sitting/lateral leans   Toilet Transfer: Moderate assistance;Ambulation;Rolling walker (2 wheels) Toilet Transfer Details (indicate cue type and reason): Partially simulated via sit to stand from EOB followed by ambulation in hall and return to EOB. Toileting- Clothing Manipulation and Hygiene: Maximal assistance;Sit to/from stand;Moderate assistance   Tub/ Shower Transfer: Maximal assistance;Moderate assistance;Rolling walker (2 wheels)   Functional mobility during ADLs: Moderate assistance;Rolling walker (2 wheels) General ADL Comments: Pt able to ambulate several feet in hall but apeared to fatuge and require more assist and verbal cuing last 10% of ambulation.     Vision Patient Visual Report: Other (comment) (No visual history reported at this time.) Vision Assessment?:  (Difficult to assess due to pt's cognitive deficits.)                Pertinent Vitals/Pain Pain Assessment Pain Assessment: Faces Faces Pain Scale: No hurt     Hand Dominance Right (Per observation of pt opening a container.)   Extremity/Trunk Assessment Upper Extremity Assessment Upper Extremity Assessment: Difficult to assess due to impaired cognition [facility] P/ROM or B shoulder flexion, but tone in L UE, but unsure if pt was resisting P/ROM of shoulder or not. Pt able to open doedorant  container with assist for direction following via tactile cues.)   Lower Extremity Assessment Lower Extremity Assessment: Defer to PT evaluation   Cervical / Trunk Assessment Cervical / Trunk Assessment: Kyphotic   Communication Communication Communication: Expressive difficulties;Receptive difficulties   Cognition Arousal/Alertness: Awake/alert Behavior During Therapy: Impulsive Overall Cognitive Status: No family/caregiver present to determine baseline cognitive functioning                                 General Comments: Pt follows one step commands intermittently with need of tactile and verbal cuing. Pt able to say what sounded like his name and something about going home.                      Home Living Family/patient expects to be discharged to:: Unsure (Pt reportedly was found in his apartment complex. Pt unable to report on home living situation.)                                        Prior Functioning/Environment Prior Level of Function : Patient poor historian/Family not available             Mobility Comments: Unsure at this time. ADLs Comments: Unsure at this time.        OT Problem List: Decreased strength;Decreased range of motion;Decreased activity tolerance;Impaired balance (sitting and/or  standing);Impaired UE functional use;Impaired vision/perception;Impaired tone;Decreased cognition;Decreased coordination      OT Treatment/Interventions: Self-care/ADL training;Therapeutic exercise;Therapeutic activities;Neuromuscular education;DME and/or AE instruction;Balance training;Patient/family education;Visual/perceptual remediation/compensation;Cognitive remediation/compensation    OT Goals(Current goals can be found in the care plan section) Acute Rehab OT Goals Patient Stated Goal: go home OT Goal Formulation: With patient Time For Goal Achievement: 2022-04-07 Potential to Achieve Goals: Fair  OT Frequency: Min  2X/week    Co-evaluation PT/OT/SLP Co-Evaluation/Treatment: Yes Reason for Co-Treatment: Complexity of the patient's impairments (multi-system involvement)   OT goals addressed during session: ADL's and self-care      AM-PAC OT "6 Clicks" Daily Activity     Outcome Measure                 End of Session Equipment Utilized During Treatment: Rolling walker (2 wheels);Gait belt  Activity Tolerance: Patient tolerated treatment well Patient left: in bed;with call bell/phone within reach;with bed alarm set  OT Visit Diagnosis: Unsteadiness on feet (R26.81);Other abnormalities of gait and mobility (R26.89);Muscle weakness (generalized) (M62.81);Other symptoms and signs involving cognitive function;Cognitive communication deficit (R41.841) Symptoms and signs involving cognitive functions: Cerebral infarction                Time: 0902-0920 OT Time Calculation (min): 18 min Charges:  OT General Charges $OT Visit: 1 Visit OT Evaluation $OT Eval Low Complexity: 1 Low  Nyjah Schwake OT, MOT   [name] 03/12/2022, 9:42 AM

## 2022-03-12 NOTE — Plan of Care (Signed)
  Problem: Acute Rehab PT Goals(only PT should resolve) Goal: Pt Will Go Supine/Side To Sit Outcome: Progressing Flowsheets (Taken 03/12/2022 1213) Pt will go Supine/Side to Sit: with min guard assist Goal: Patient Will Transfer Sit To/From Stand Outcome: Progressing Flowsheets (Taken 03/12/2022 1213) Patient will transfer sit to/from stand:  with min guard assist  with minimal assist Goal: Pt Will Transfer Bed To Chair/Chair To Bed Outcome: Progressing Flowsheets (Taken 03/12/2022 1213) Pt will Transfer Bed to Chair/Chair to Bed:  with min assist  min guard assist Goal: Pt Will Ambulate Outcome: Progressing Flowsheets (Taken 03/12/2022 1213) Pt will Ambulate:  50 feet  with min guard assist  with minimal assist  with rolling walker   12:14 PM, 03/12/22 Ocie Bob, MPT Physical Therapist with [facility] [phone] office [phone] mobile phone

## 2022-03-13 ENCOUNTER — Inpatient Hospital Stay (HOSPITAL_COMMUNITY): Payer: Medicare Other

## 2022-03-13 DIAGNOSIS — E538 Deficiency of other specified B group vitamins: Secondary | ICD-10-CM | POA: Diagnosis not present

## 2022-03-13 DIAGNOSIS — I63511 Cerebral infarction due to unspecified occlusion or stenosis of right middle cerebral artery: Secondary | ICD-10-CM | POA: Diagnosis not present

## 2022-03-13 DIAGNOSIS — G20A1 Parkinson's disease without dyskinesia, without mention of fluctuations: Secondary | ICD-10-CM | POA: Diagnosis not present

## 2022-03-13 LAB — BASIC METABOLIC PANEL
Anion gap: 8 (ref 5–15)
BUN: 9 mg/dL (ref 8–23)
CO2: 23 mmol/L (ref 22–32)
Calcium: 8.5 mg/dL — ABNORMAL LOW (ref 8.9–10.3)
Chloride: 109 mmol/L (ref 98–111)
Creatinine, Ser: 0.72 mg/dL (ref 0.61–1.24)
GFR, Estimated: >60 mL/min (ref >60)
Glucose, Bld: 104 mg/dL — ABNORMAL HIGH (ref 70–99)
Potassium: 3.3 mmol/L — ABNORMAL LOW (ref 3.5–5.1)
Sodium: 140 mmol/L (ref 135–145)

## 2022-03-13 LAB — MAGNESIUM: Magnesium: 2 mg/dL (ref 1.7–2.4)

## 2022-03-13 MED ORDER — ONDANSETRON HCL 4 MG PO TABS: 4.0000 mg | ORAL_TABLET | Freq: Four times a day (QID) | ORAL | Status: DC | PRN

## 2022-03-13 MED ORDER — ACETAMINOPHEN 325 MG PO TABS
650.0000 mg | ORAL_TABLET | Freq: Four times a day (QID) | ORAL | Status: DC | PRN
  Administered 2022-03-15: 650 mg
  Filled 2022-03-13: qty 2

## 2022-03-13 MED ORDER — ONDANSETRON HCL 4 MG/2ML IJ SOLN: 4.0000 mg | Freq: Four times a day (QID) | INTRAMUSCULAR | Status: DC | PRN

## 2022-03-13 MED ORDER — ACETAMINOPHEN 650 MG RE SUPP
650.0000 mg | Freq: Four times a day (QID) | RECTAL | Status: DC | PRN
  Administered 2022-03-14: 650 mg via RECTAL
  Filled 2022-03-13: qty 1

## 2022-03-13 NOTE — TOC Progression Note (Addendum)
Transition of Care [facility]) - Progression Note    Patient Details  Name: Ozie Lupe MRN: [id number] Date of Birth: 1956-03-05  [facility] CM/SW Contact  Karn Cassis, [place] Phone Number: 03/13/2022, 12:10 PM  Clinical Narrative:  Pt continues to be oriented to self only. Per RN, pt's neighbor called and said pt has a brother who lives at [facility], but no name. LCSW attempted to reach [facility], but no answer. LCSW made APS report to Roxanne Mins at [facility] DSS to start potential guardianship process. Report will be given to APS supervisor and TOC will be contacted. MD updated.    Update: DSS to assess this afternoon and if appropriate will file for interim guardianship today. Per Nilda Calamity at DSS, they will be unable to make decisions about possible PEG with interim guardianship. However, if medically necessary 2 MDs can agree to have PEG placed. Pt can be referred and placed at SNF under interim guardianship. TOC will continue to follow.      Barriers to Discharge: Continued Medical Work up  Expected Discharge Plan and Services In-house Referral: Clinical Social Work     Living arrangements for the past 2 months: Apartment                                       Social Determinants of Health (SDOH) Interventions SDOH Screenings   Food Insecurity: No Food Insecurity (03/11/2022)  Housing: Low Risk  (03/11/2022)  Transportation Needs: No Transportation Needs (03/11/2022)  Utilities: Not At Risk (03/11/2022)  Tobacco Use: Low Risk  (03/15/2022)    Readmission Risk Interventions     No data to display

## 2022-03-13 NOTE — Plan of Care (Signed)
  Problem: Safety: Goal: Non-violent Restraint(s) Outcome: Progressing   Problem: Clinical Measurements: Goal: Ability to maintain clinical measurements within normal limits will improve Outcome: Progressing Goal: Will remain free from infection Outcome: Progressing Goal: Diagnostic test results will improve Outcome: Progressing Goal: Respiratory complications will improve Outcome: Progressing Goal: Cardiovascular complication will be avoided Outcome: Progressing   Problem: Coping: Goal: Level of anxiety will decrease Outcome: Progressing   Problem: Elimination: Goal: Will not experience complications related to bowel motility Outcome: Progressing Goal: Will not experience complications related to urinary retention Outcome: Progressing   Problem: Pain Managment: Goal: General experience of comfort will improve Outcome: Progressing   Problem: Safety: Goal: Ability to remain free from injury will improve Outcome: Progressing   Problem: Skin Integrity: Goal: Risk for impaired skin integrity will decrease Outcome: Progressing

## 2022-03-13 NOTE — Progress Notes (Signed)
RN to bedside to assess Pt. Pt arouses to voice he does not answer questions appropriately. Speech slurred and incoherent. Pt noted to have mittens on bilaterally. No soft restraints on Pt. Restraint orders expired. Discussed with charge RN Wallace Cullens next steps and she states it is okay to let the orders expire as the patient does not have on soft restraints at the time. Pt is cooperative at this time. RN provided mouth care, suction the pt with thick white secretions noted. Pt mouth dry with crust noted to his lips and a purple tongue with poor detention. Ordered meds given per order via NG tube.

## 2022-03-13 NOTE — Progress Notes (Signed)
   03/13/22 2057  Assess: MEWS Score  Temp (!) 101.1 F (38.4 C)  BP 123/73  MAP (mmHg) 87  Pulse Rate 98  Resp 18  SpO2 93 %  O2 Device Room Air  Assess: MEWS Score  MEWS Temp 1  MEWS Systolic 0  MEWS Pulse 0  MEWS RR 0  MEWS LOC 1  MEWS Score 2  MEWS Score Color Yellow  Assess: if the MEWS score is Yellow or Red  Were vital signs taken at a resting state? Yes  Focused Assessment Change from prior assessment (see assessment flowsheet)  Does the patient meet 2 or more of the SIRS criteria? No  MEWS guidelines implemented *See Row Information* Yes  Treat  MEWS Interventions Administered prn meds/treatments  Pain Scale Faces  Pain Score 0  Take Vital Signs  Increase Vital Sign Frequency  Yellow: Q 2hr X 2 then Q 4hr X 2, if remains yellow, continue Q 4hrs  Escalate  MEWS: Escalate Yellow: discuss with charge nurse/RN and consider discussing with provider and RRT  Notify: Charge Nurse/RN  Name of Charge Nurse/RN Notified Bree RN  Date Charge Nurse/RN Notified 03/13/22  Time Charge Nurse/RN Notified 2133  Provider Notification  Provider Name/Title  (Dr Herbie [place])  Date Provider Notified 03/13/22  Time Provider Notified 2133  Method of Notification Call (secure chat)  Notification Reason Other (Comment) (Mews protocol)  Provider response Other (Comment);No new orders (orders to update in 1 hour)  Date of Provider Response 03/13/22  Time of Provider Response 2136  Notify: Rapid Response  Name of Rapid Response RN Notified  (n/a)  Document  Patient Outcome Other (Comment) (Will reassess in 30- 1 hour)  Progress note created (see row info) Yes  Assess: SIRS CRITERIA  SIRS Temperature  1  SIRS Pulse 1  SIRS Respirations  0  SIRS WBC 0  SIRS Score Sum  2

## 2022-03-13 NOTE — Progress Notes (Signed)
   03/13/22 2057  Assess: MEWS Score  Temp (!) 101.1 F (38.4 C)  BP 123/73  MAP (mmHg) 87  Pulse Rate 98  Resp 18  SpO2 93 %  O2 Device Room Air  Assess: MEWS Score  MEWS Temp 1  MEWS Systolic 0  MEWS Pulse 0  MEWS RR 0  MEWS LOC 1  MEWS Score 2  MEWS Score Color Yellow  Assess: SIRS CRITERIA  SIRS Temperature  1  SIRS Pulse 1  SIRS Respirations  0  SIRS WBC 0  SIRS Score Sum  2

## 2022-03-13 NOTE — Progress Notes (Signed)
Patient Information  Patient Name Ronnell, Clinger ([id number]) Legal Sex Male DOB March 08, 1956  Room Bed  A314 A314-01    Patient Demographics  Address [address] [place] [address] Phone [phone] (Home) *Preferred*    Basic Information  Date Of Birth 02-01-1956 Gender Identity Male Race White or Caucasian Ethnic Group Not Hispanic or Latino Preferred Language English    Patient Contacts  Name Relation Home Work Mobile  McEwan,Scott Other     McEwan,Kim Other       Documents on File   Status Date Received Description  Documents for the Patient  Driver's License Not Received    Insurance Card Not Received    Other Photo ID Not Received    [place] HIPAA NOTICE OF PRIVACY - Scanned Not Received    [place] E-Signature HIPAA Notice of Privacy Signed 05/05/17   Advance Directives/Living Will/HCPOA/POA Not Received    Insurance Card Received 05/05/17 MEDICAID 2019  Release of Information Received 05/06/17 DPR [facility] 2019  AMB HH/NH/Hospice Received 05/12/17 CASE COMMUNICATION REPORT [facility]  AMB HH/NH/Hospice Received 07/30/17 CERTIFICATION POC [facility]  Insurance Card Received 11/24/18 medicaid 2020  Insurance Card Received 04/13/19 MEDICAID 2021  Insurance Card Received 06/10/19 Medicaid  AMB Provider Completed Forms Received 06/25/19 TREATMENT FORM [facility]  AMB Correspondence Received 07/12/19 DECISION ON REQUEST FOR A MEDICAID SERVICE [place]  AMB Correspondence Received 06/17/19 PATIENT COVERAGE SUMMARY [facility]  AMB Correspondence Received 07/05/19 DECISION ON REQUEST FOR A MEDICAID SERVICE [employer] Card Received (Expired) 05/22/20   Release of Information Received 02/27/21 02/27/21 DPR [facility]  Insurance Card Received 08/14/21   Documents for the Encounter  AOB  (Assignment of Insurance Benefits) Not Received    E-signature AOB Unable to Obtain 03/11/22 due to pt's condition unresponsive  MEDICARE  RIGHTS Not Received    E-signature Medicare Rights Unable to Obtain 03/11/22 due to pt's condition unresponsive  Annual Exam - E-Signature PCP     ED Patient Billing Extract   ED PB Billing Extract  Medicare Observation     Clinical References Attachment   Ischemic Stroke  Easy-to-Read (English)  Cardiac Monitoring Strip Shift Summary Received 03/12/22   Cardiac Monitoring Strip Received 03/13/22   EKG Received 03/11/22   Echocardiogram Complete Received 03/11/22     Admission Information   Current Information  Attending Provider Admitting Provider Admission Type Admission Status  Erick Blinks, MD [place], [name] J, DO Emergency Confirmed Admission        Admission Date/Time Discharge Date Hospital Service Auth/Cert Status  03/03/2022  2252  Acute Care Incomplete        Hospital Area Unit Room/Bed   [facility] AP-DEPT 300 A314/A314-01             Admission  Complaint  [facility] Account  Name Acct ID Class Status Primary Coverage  Akili, Corsetti [id number] Inpatient Open MEDICARE - MEDICARE PART A AND B        Guarantor Account (for Hospital Account [id number])  Name Relation to Pt Service Area Active? Acct Type  [name] Self [facility] Yes Personal/Family  Address Phone    [address] [place], [place] [address] [phone](H)          Coverage Information (for Hospital Account [id number])  F/O Payor/Plan Precert #  MEDICARE/MEDICARE PART A AND B   Subscriber Subscriber #  [place], [place] [id number]  Address Phone  [address] [place], [place] [address]          Care Everywhere ID:  [id number]

## 2022-03-13 NOTE — Progress Notes (Signed)
   03/13/22 2251  Assess: MEWS Score  Temp 99.3 F (37.4 C)  BP 125/75  MAP (mmHg) 89  Pulse Rate 95  Resp 18  SpO2 95 %  O2 Device Room Air  Assess: MEWS Score  MEWS Temp 0  MEWS Systolic 0  MEWS Pulse 0  MEWS RR 0  MEWS LOC 1  MEWS Score 1  MEWS Score Color Green  Assess: SIRS CRITERIA  SIRS Temperature  0  SIRS Pulse 1  SIRS Respirations  0  SIRS WBC 0  SIRS Score Sum  1   Current vitals represents green Mews 0. Pt stabilized after PRN medication given.

## 2022-03-13 NOTE — Progress Notes (Signed)
PROGRESS NOTE  Thomas Huber ZOX:[id number] DOB: February 16, 1956 DOA: 03/09/2022 PCP: Ignatius Specking, MD  Brief History:  66 year old male with a history of Parkinson's disease presenting when he was found unresponsive in his apartment.  The last known well time was unclear.  History was unobtainable because of the patient's encephalopathy.  History is obtained from review of the medical record.  Apparently, EMS noted the patient to have facial droop, slurred speech, left arm weakness.  On 03/11/2022 after arrival, the patient eventually became agitated and required sedation with IV Ativan.  An NG tube was subsequently placed because of his dysphagia.  Neurology was consulted.  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 In the ED, the patient has been afebrile and hemodynamically stable with oxygen saturation 95-90% room air.  WBC 9.0, hemoglobin 15.0, platelets 1 49,000.  Sodium 139, potassium 3.7, bicarbonate 25, serum creatinine 0.81.  LFTs were unremarkable. Notably, the patient did have a long history of alcohol dependence previously, but quit slightly over 1 year prior to this admission.   Assessment/Plan: Acute ischemic stroke with cytotoxic edema -Appreciate Neurology Consult -PT/OT evaluation pending CIR versus SNF -Speech therapy eval -CT brain--no acute abnormality -MRI brain--acute right MCA infarct with cytotoxic edema -MRA brain--right MCA M2 occlusion -Carotid Duplex--no hemodynamically significant stenosis -Echo--EF 60-65%, no WMA, grade 1 DD, no PFO -Will need 14-day event monitor after discharge -LDL--85 -HbA1C--5.2 -Antiplatelet--ASA 81 + Plavix 75 mg x 3 weeks, then ASA 81 alone  Acute metabolic encephalopathy -Secondary to ischemic stroke with cytotoxic edema and B12 deficiency -UA negative for pyuria -B12 --109>> supplementing -Folic acid 8.5 -Ammonia 24 -TSH 1.3  Dyslipidemia -Continue  statin -LDL 85  Parkinson's disease -Currently on Sinemet through NG tube  Alcohol dependence in remission -Start thiamine  Dysphagia -NG inserted -Speech therapy following to consider further evaluation -continue IVF  Goals of care -Currently has NG tube for meds -He has significant dysarthria and will likely not be able to swallow -May need to consider PEG tube placement -Unfortunately, his overall goals of care for this situation is not known and there does not appear to be any family involved at this time -Will request palliative care to help further assess goals of care -If he is having significant dysphagia/aspiration of his oral secretions, his long-term prognosis is poor -I would think that a DNR order would be appropriate in this situation -Patient cannot consistently answer questions and therefore I do not think it is able to make his own decisions at this time    Family Communication: No family available.  DSS contacted for guardianship  Consultants:  neurology  Code Status:  FULL   DVT Prophylaxis:  [place] Heparin   Procedures: As Listed in Progress Note Above  Antibiotics: None      Subjective: He does not answer any questions.  He does follow commands.  Able to raise his right arm and right leg when asked to do so.  He is eyes are open and he makes eye contact.  Objective: Vitals:   03/12/22 1700 03/12/22 2006 03/13/22 0517 03/13/22 1411  BP: (!) 142/68 (!) 151/78 130/65 109/62  Pulse: 100 83 87 71  Resp: Temp: 98 F (36.7 C) 98.4 F (36.9 C) (!) 97.4 F (36.3 C) 98.7 F (37.1 C)  TempSrc: Oral Oral  Axillary  SpO2: 97% 99%  92%   Weight:      Height:        Intake/Output Summary (Last 24 hours) at 03/13/2022 1953 Last data filed at 03/13/2022 1800 Gross per 24 hour  Intake 1983.17 ml  Output 1650 ml  Net 333.17 ml   Weight change:  Exam:  General:  Pt is alert, follows commands appropriately, not in acute  distress HEENT: No icterus, No thrush, No neck mass, [place]/AT, NG tube in place Cardiovascular: RRR, S1/S2, no rubs, no gallops Respiratory: Bilateral rhonchi no wheezing.  Rhonchi Abdomen: Soft/+BS, non tender, non distended, no guarding Extremities: No edema, No lymphangitis, No petechiae, No rashes, no synovitis Neuro:  +right face droop, strength 4/5 in RUE, RLE, strength 0 out of 5 left upper and lower extremity; sensation intact bilateral; no dysmetria; babinski equivocal    Data Reviewed: I have personally reviewed following labs and imaging studies Basic Metabolic Panel: Recent Labs  Lab 2022-04-01 2258 04/01/2022 2320 03/11/22 0616 03/12/22 0534 03/13/22 0402  NA 141 138 139 140 140  K 3.9 3.7 3.7 4.1 3.3*  CL 103 103 106 107 109  CO2  --  GLUCOSE 96 99 118* 114* 104*  BUN CREATININE 1.10 1.09 0.81 0.93 0.72  CALCIUM  --  8.9 9.0 9.0 8.5*  MG  --   --  2.1  --  2.0  PHOS  --   --  2.8  --   --    Liver Function Tests: Recent Labs  Lab 01-Apr-2022 2320 03/11/22 0616  AST 17 16  ALT 10 12  ALKPHOS 63 61  BILITOT 1.0 1.2  PROT 7.5 7.0  ALBUMIN 4.2 3.8   No results for input(s): "LIPASE", "AMYLASE" in the last 168 hours. Recent Labs  Lab 03/11/22 1033  AMMONIA 24   Coagulation Profile: Recent Labs  Lab Apr 01, 2022 2320  INR 1.0   CBC: Recent Labs  Lab 01-Apr-2022 2258 04-01-22 2320 03/11/22 0616 03/12/22 0534  WBC  --  9.2 9.0 9.0  NEUTROABS  --  6.3  --   --   HGB 15.0 14.3 15.0 15.0  HCT 44.0 41.7 43.8 44.2  MCV  --  90.1 89.8 90.8  PLT  --  176 170 149*   Cardiac Enzymes: Recent Labs  Lab 03/12/22 0534  CKTOTAL 494*   BNP: Invalid input(s): "POCBNP" CBG: Recent Labs  Lab 04/01/22 2252 03/12/22 0803  GLUCAP 92 100*   HbA1C: Recent Labs    03/11/22 0616  HGBA1C 5.2   Urine analysis:    Component Value Date/Time   COLORURINE YELLOW 03/11/2022 0822   APPEARANCEUR CLEAR 03/11/2022 0822   LABSPEC 1.014  03/11/2022 0822   PHURINE 7.0 03/11/2022 0822   GLUCOSEU NEGATIVE 03/11/2022 0822   HGBUR SMALL (A) 03/11/2022 0822   BILIRUBINUR NEGATIVE 03/11/2022 0822   KETONESUR 5 (A) 03/11/2022 0822   PROTEINUR NEGATIVE 03/11/2022 0822   NITRITE NEGATIVE 03/11/2022 0822   LEUKOCYTESUR NEGATIVE 03/11/2022 0822   Sepsis Labs: (procalcitonin:4,lacticidven:4) ) Recent Results (from the past 240 hour(s))  Resp panel by RT-PCR (RSV, Flu A&B, Covid) Anterior Nasal Swab     Status: None   Collection Time: 04-01-2022 11:20 PM   Specimen: Anterior Nasal Swab  Result Value Ref Range Status   SARS Coronavirus 2 by RT PCR NEGATIVE NEGATIVE Final    Comment: (NOTE) SARS-CoV-2 target nucleic acids are NOT DETECTED.  The SARS-CoV-2 RNA is generally detectable in upper  respiratory specimens during the acute phase of infection. The lowest concentration of SARS-CoV-2 viral copies this assay can detect is 138 copies/mL. A negative result does not preclude SARS-Cov-2 infection and should not be used as the sole basis for treatment or other patient management decisions. A negative result may occur with  improper specimen collection/handling, submission of specimen other than nasopharyngeal swab, presence of viral mutation(s) within the areas targeted by this assay, and inadequate number of viral copies(<138 copies/mL). A negative result must be combined with clinical observations, patient history, and epidemiological information. The expected result is Negative.  Fact Sheet for Patients:  BloggerCourse.com  Fact Sheet for Healthcare Providers:  SeriousBroker.it  This test is no t yet approved or cleared by the [place] FDA and  has been authorized for detection and/or diagnosis of SARS-CoV-2 by FDA under an Emergency Use Authorization (EUA). This EUA will remain  in effect (meaning this test can be used) for the duration of the COVID-19  declaration under Section 564(b)(1) of the Act, 21 U.S.C.section 360bbb-3(b)(1), unless the authorization is terminated  or revoked sooner.       Influenza A by PCR NEGATIVE NEGATIVE Final   Influenza B by PCR NEGATIVE NEGATIVE Final    Comment: (NOTE) The Xpert Xpress SARS-CoV-2/FLU/RSV plus assay is intended as an aid in the diagnosis of influenza from Nasopharyngeal swab specimens and should not be used as a sole basis for treatment. Nasal washings and aspirates are unacceptable for Xpert Xpress SARS-CoV-2/FLU/RSV testing.  Fact Sheet for Patients: BloggerCourse.com  Fact Sheet for Healthcare Providers: SeriousBroker.it  This test is not yet approved or cleared by the [place] FDA and has been authorized for detection and/or diagnosis of SARS-CoV-2 by FDA under an Emergency Use Authorization (EUA). This EUA will remain in effect (meaning this test can be used) for the duration of the COVID-19 declaration under Section 564(b)(1) of the Act, 21 U.S.C. section 360bbb-3(b)(1), unless the authorization is terminated or revoked.     Resp Syncytial Virus by PCR NEGATIVE NEGATIVE Final    Comment: (NOTE) Fact Sheet for Patients: BloggerCourse.com  Fact Sheet for Healthcare Providers: SeriousBroker.it  This test is not yet approved or cleared by the [place] FDA and has been authorized for detection and/or diagnosis of SARS-CoV-2 by FDA under an Emergency Use Authorization (EUA). This EUA will remain in effect (meaning this test can be used) for the duration of the COVID-19 declaration under Section 564(b)(1) of the Act, 21 U.S.C. section 360bbb-3(b)(1), unless the authorization is terminated or revoked.  Performed at [facility], [address], [place], [place] [address]   Culture, blood (Routine X 2) w Reflex to ID Panel     Status: None (Preliminary result)    Collection Time: 03/12/22 12:55 PM   Specimen: Right Antecubital; Blood  Result Value Ref Range Status   Specimen Description RIGHT ANTECUBITAL  Final   Special Requests   Final    BOTTLES DRAWN AEROBIC AND ANAEROBIC Blood Culture adequate volume   Culture   Final    NO GROWTH < 24 HOURS Performed at [facility], [address] [place], [place] [address]    Report Status PENDING  Incomplete  Culture, blood (Routine X 2) w Reflex to ID Panel     Status: None (Preliminary result)   Collection Time: 03/12/22 12:55 PM   Specimen: BLOOD RIGHT FOREARM  Result Value Ref Range Status   Specimen Description BLOOD RIGHT FOREARM  Final   Special Requests  Final    BOTTLES DRAWN AEROBIC AND ANAEROBIC Blood Culture adequate volume   Culture   Final    NO GROWTH < 24 HOURS Performed at [facility], [address] [place], [place] [address]    Report Status PENDING  Incomplete     Scheduled Meds:   stroke: early stages of recovery book   Does not apply Once   aspirin  300 mg Rectal Daily   atorvastatin  40 mg Per Tube Daily   carbidopa-levodopa  3 tablet Per Tube q morning   And   carbidopa-levodopa  2 tablet Per NG tube Q1200   And   carbidopa-levodopa  1 tablet Per Tube BID   clopidogrel  75 mg Per Tube Daily   vitamin B-12  1,000 mcg Per Tube Daily   thiamine (VITAMIN B1) injection  100 mg Intravenous Daily   Continuous Infusions:  dextrose 5 % and 0.9% NaCl 75 mL/hr at 03/13/22 1651    Procedures/Studies: DG Chest Port 1 View  Result Date: 03/13/2022 CLINICAL DATA:  NG tube placement EXAM: PORTABLE CHEST 1 VIEW COMPARISON:  02/26/2022 FINDINGS: There is interval placement of NG tube with its tip in the region of antrum of the stomach. There is poor inspiration. There are no signs of pulmonary edema or focal pulmonary consolidation. Small linear densities are seen in right lower lung field. There is no pleural effusion or pneumothorax. IMPRESSION: Tip of enteric tube is seen in  the distal antrum of the stomach. There are small linear densities in right lower lung field which may suggest crowding of bronchovascular structures due to poor inspiration or subsegmental atelectasis. Electronically Signed   By: Ernie Avena M.D.   On: 03/13/2022 18:53   ECHOCARDIOGRAM COMPLETE  Result Date: 03/11/2022    ECHOCARDIOGRAM REPORT   Patient Name:   JOVANIE VERGE Date of Exam: 03/11/2022 Medical Rec #:  [id number]     Height:       67.0 in Accession #:    [id number]    Weight:       190.0 lb Date of Birth:  [birth year]     BSA:          1.979 m Patient Age:    66 years      BP:           146/65 mmHg Patient Gender: M             HR:           70 bpm. [facility] Procedure: 2D Echo, Cardiac Doppler and Color Doppler Indications:    Stroke I63.9  History:        Patient has no prior history of Echocardiogram examinations.                 Stroke. Parkinson's disease.  Sonographer:    Celesta Gentile RCS Referring Phys: [id number] OLADAPO ADEFESO  Sonographer Comments: Technically difficult study due to patient not able to understand instructions and very difficult imaging windows. Could NOT image IVC or subcostal. IMPRESSIONS  1. Left ventricular ejection fraction, by estimation, is 60 to 65%. The left ventricle has normal function. The left ventricle has no regional wall motion abnormalities. Left ventricular diastolic parameters are consistent with Grade I diastolic dysfunction (impaired relaxation).  2. Right ventricular systolic function poorly visualized but appears to be normal. The right ventricular size is normal.  3. The mitral valve was not well visualized. No evidence of mitral valve regurgitation. No evidence  of mitral stenosis.  4. The aortic valve is tricuspid. Aortic valve regurgitation is not visualized. No aortic stenosis is present. Comparison(s): No prior Echocardiogram. FINDINGS  Left Ventricle: Left ventricular ejection fraction, by estimation, is 60 to 65%. The  left ventricle has normal function. The left ventricle has no regional wall motion abnormalities. Definity contrast agent was given IV to delineate the left ventricular  endocardial borders. The left ventricular internal cavity size was normal in size. There is no left ventricular hypertrophy. Left ventricular diastolic parameters are consistent with Grade I diastolic dysfunction (impaired relaxation). Right Ventricle: The right ventricular size is normal. Right vetricular wall thickness was not well visualized. Right ventricular systolic function poorly visualized but appears to be normal. Left Atrium: Left atrial size was normal in size. Right Atrium: Right atrial size was normal in size. Pericardium: There is no evidence of pericardial effusion. Mitral Valve: The mitral valve was not well visualized. No evidence of mitral valve regurgitation. No evidence of mitral valve stenosis. Tricuspid Valve: The tricuspid valve is not well visualized. Tricuspid valve regurgitation is not demonstrated. No evidence of tricuspid stenosis. Aortic Valve: The aortic valve is tricuspid. Aortic valve regurgitation is not visualized. No aortic stenosis is present. Pulmonic Valve: The pulmonic valve was not well visualized. Pulmonic valve regurgitation is not visualized. No evidence of pulmonic stenosis. Aorta: The aortic root is normal in size and structure. Venous: The inferior vena cava was not well visualized. IAS/Shunts: The interatrial septum was not assessed.  LEFT VENTRICLE PLAX 2D LVIDd:         4.10 cm   Diastology LVIDs:         2.40 cm   LV e' medial:    5.44 cm/s LV PW:         1.00 cm   LV E/e' medial:  10.2 LV IVS:        1.00 cm   LV e' lateral:   8.38 cm/s LVOT diam:     1.90 cm   LV E/e' lateral: 6.6 LV SV:         58 LV SV Index:   30 LVOT Area:     2.84 cm  RIGHT VENTRICLE TAPSE (M-mode): 2.1 cm LEFT ATRIUM           Index LA diam:      4.10 cm 2.07 cm/m LA Vol (A2C): 39.2 ml 19.81 ml/m LA Vol (A4C): 48.4 ml  24.46 ml/m  AORTIC VALVE LVOT Vmax:   104.00 cm/s LVOT Vmean:  68.200 cm/s LVOT VTI:    0.206 m  AORTA Ao Root diam: 3.00 cm MITRAL VALVE MV Area (PHT): 2.50 cm    SHUNTS MV Decel Time: 303 msec    Systemic VTI:  0.21 m MV E velocity: 55.70 cm/s  Systemic Diam: 1.90 cm MV A velocity: 87.80 cm/s MV E/A ratio:  0.63 Vishnu Priya Mallipeddi Electronically signed by Winfield Rast Mallipeddi Signature Date/Time: 03/11/2022/3:26:57 PM    Final    DG Abdomen 1 View  Result Date: 03/11/2022 CLINICAL DATA:  NG tube placement EXAM: ABDOMEN - 1 VIEW COMPARISON:  None Available. FINDINGS: Tip of enteric tube is seen in the region of distal antrum of the stomach. There is presence of gas in few nondilated small bowel loops in upper abdomen. Lower abdomen and pelvis are not included in the image. IMPRESSION: Tip of enteric tube is seen in the distal antrum of the stomach. Electronically Signed   By: Harlan Stains.D.  On: 03/11/2022 13:15   US Carotid Bilateral  Result Date: 03/11/2022 CLINICAL DATA:  Acute right MCA territory cerebral infarction. EXAM: BILATERAL CAROTID DUPLEX ULTRASOUND TECHNIQUE: Wallace CullensGray scale imaging, color Doppler and duplex ultrasound were performed of bilateral carotid and vertebral arteries in the neck. COMPARISON:  None Available. FINDINGS: Criteria: Quantification of carotid stenosis is based on velocity parameters that correlate the residual internal carotid diameter with NASCET-based stenosis levels, using the diameter of the distal internal carotid lumen as the denominator for stenosis measurement. The following velocity measurements were obtained: RIGHT ICA:  58/16 cm/sec CCA:  83/11 cm/sec SYSTOLIC ICA/CCA RATIO:  0.7 ECA:  112 cm/sec LEFT ICA:  62/17 cm/sec CCA:  97/10 cm/sec SYSTOLIC ICA/CCA RATIO:  0.6 ECA:  93 cm/sec RIGHT CAROTID ARTERY: Mild amount of partially calcified plaque at the level of the carotid bulb and proximal right ICA. Estimated right ICA stenosis is less than  50%. RIGHT VERTEBRAL ARTERY: Antegrade flow with normal waveform and velocity. LEFT CAROTID ARTERY: Minimal partially calcified plaque at the level of the left carotid bulb and proximal ICA. Estimated left ICA stenosis is less than 50% LEFT VERTEBRAL ARTERY: Antegrade flow with normal waveform and velocity. IMPRESSION: Mild amount of plaque at the level of both carotid bulbs and proximal internal carotid arteries, right greater than left. No significant carotid stenosis identified. Estimated bilateral ICA stenoses are less than 50%. Electronically Signed   By: Irish LackGlenn  Yamagata M.D.   On: 03/11/2022 11:27   MR BRAIN WO CONTRAST  Addendum Date: 03/11/2022   ADDENDUM REPORT: 03/11/2022 10:53 ADDENDUM: Study discussed with Dr. Tanda RockersSamuel Gray in the ED by telephone on 03/11/2022 at 10:44 . Electronically Signed   By: [place] FlemingH  Hall M.D.   On: 03/11/2022 10:53   Result Date: 03/11/2022 CLINICAL DATA:  66 year old male altered mental status. Neurologic deficit. EXAM: MRI HEAD WITHOUT CONTRAST TECHNIQUE: Multiplanar, multiecho pulse sequences of the brain and surrounding structures were obtained without intravenous contrast. COMPARISON:  Head CT yesterday. FINDINGS: Brain: Confluent restricted diffusion in the right insula and frontal operculum in an area of about 3.5 cm (series 5, image 20), tracking cephalad to toward the superior frontal gyrus. T2 and FLAIR hyperintense Cytotoxic edema. No hemorrhagic transformation. No mass effect. No contralateral left hemisphere or posterior fossa restricted diffusion. No midline shift, mass effect, evidence of mass lesion, ventriculomegaly, extra-axial collection or acute intracranial hemorrhage. Cervicomedullary junction and pituitary are within normal limits. Outside of the 8 acutely affected parenchyma gray and white matter signal is largely normal for age. Tiny chronic infarct in the right cerebellum series 15, image 1. Probable tiny chronic lacunar infarct right caudate nucleus  series 18, image 29. Vascular: Major intracranial vascular flow voids are preserved. See MRA today reported separately. Skull and upper cervical spine: Negative. Visualized bone marrow signal is within normal limits. Orbits/sinuses: Negative orbits. Paranasal sinuses and mastoids are stable and well aerated. Other: Negative visible scalp and face. IMPRESSION: 1. Positive for acute Right MCA territory infarct, middle sylvian division. Cytotoxic edema with no hemorrhage or mass effect. 2. Minimal underlying chronic small vessel disease. 3. MRA reported separately. Electronically Signed: By: [place] FlemingH  Hall M.D. On: 03/11/2022 10:38   MR ANGIO HEAD WO CONTRAST  Result Date: 03/11/2022 CLINICAL DATA:  66 year old male altered mental status. Neurologic deficit. Right MCA infarct. EXAM: MRA HEAD WITHOUT CONTRAST TECHNIQUE: Angiographic images of the Circle of Willis were acquired using MRA technique without intravenous contrast. COMPARISON:  Head CT yesterday. Brain MRI today reported separately. FINDINGS: Anterior  circulation: Intermittent mild motion artifact. Antegrade flow in both ICA siphons to the carotid termini. Patent MCA and ACA origins. Mild to moderate bilateral supraclinoid ICA stenosis suspected. Anterior communicating artery and visible ACA branches are within normal limits, the right ACA A2 might be dominant. Left MCA M1 segment and bifurcation are patent. Right MCA M1 segment is patent, but there is occlusion of the M2 middle sylvian branch at its origin at the bifurcation on series 10, image 106. The posterior M2 branch remains patent. Posterior circulation: Antegrade flow in the posterior circulation, the distal right vertebral artery appears dominant and the left appears to terminates in PICA. No visible distal vertebral stenosis. Patent basilar artery without stenosis. Patent basilar tip, SCA and PCA origins. Posterior communicating arteries are diminutive or absent. Motion artifact of the affects  bilateral PCA branch detail, proximal PCAs are patent. Anatomic variants: Dominant right vertebral artery appears to supply the basilar. Right ACA A2 may be dominant. Other: MRI head at the same time is reported separately. IMPRESSION: 1. Positive for Right MCA M2 Occlusion: middle and/or anterior division branch origin is occluded at the MCA bifurcation. 2. 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 Study discussed with Dr. Tanda Rockers in the ED by telephone on 03/11/2022 at 10:44 . Electronically Signed   By: [place] Fleming M.D.   On: 03/11/2022 10:53   DG Chest Port 1 View  Result Date: 03/11/2022 CLINICAL DATA:  Weakness EXAM: PORTABLE CHEST 1 VIEW COMPARISON:  None Available. FINDINGS: Lungs are clear.  No pleural effusion or pneumothorax. The heart is normal in size. IMPRESSION: No evidence of acute cardiopulmonary disease. Electronically Signed   By: Charline Bills M.D.   On: 03/11/2022 00:11   CT CERVICAL SPINE WO CONTRAST  Result Date: 03/11/2022 CLINICAL DATA:  Unresponsive EXAM: CT CERVICAL SPINE WITHOUT CONTRAST TECHNIQUE: Multidetector CT imaging of the cervical spine was performed without intravenous contrast. Multiplanar CT image reconstructions were also generated. RADIATION DOSE REDUCTION: This exam was performed according to the departmental dose-optimization program which includes automated exposure control, adjustment of the mA and/or kV according to patient size and/or use of iterative reconstruction technique. COMPARISON:  None Available. FINDINGS: Alignment: Normal cervical lordosis. Skull base and vertebrae: No acute fracture. No primary bone lesion or focal pathologic process. Soft tissues and spinal canal: No prevertebral fluid or swelling. No visible canal hematoma. Disc levels: Mild degenerative changes of the mid/lower cervical spine. Spinal canal is patent. Upper chest:  Visualized lung apices are clear. Other: Visualized thyroid is unremarkable. IMPRESSION: No traumatic injury of the cervical spine. Mild degenerative changes. Electronically Signed   By: Charline Bills M.D.   On: 03/11/2022 00:08   CT HEAD WO CONTRAST  Result Date: 03/11/2022 CLINICAL DATA:  Delirium. EXAM: CT HEAD WITHOUT CONTRAST TECHNIQUE: Contiguous axial images were obtained from the base of the skull through the vertex without intravenous contrast. RADIATION DOSE REDUCTION: This exam was performed according to the departmental dose-optimization program which includes automated exposure control, adjustment of the mA and/or kV according to patient size and/or use of iterative reconstruction technique. COMPARISON:  Head CT 03/18/2019 FINDINGS: Brain: No evidence of acute infarction, hemorrhage, hydrocephalus, extra-axial collection or mass lesion/mass effect. Vascular: No hyperdense vessel or unexpected calcification. Skull: Normal. Negative for fracture or focal lesion. Sinuses/Orbits: Patient is status post sinonasal surgery on the left. Mastoid air cells are clear. Chronic nasal bone fractures are present. Orbits  are within normal limits. Other: None. IMPRESSION: No acute intracranial abnormality. Electronically Signed   By: Darliss Cheney M.D.   On: 03/11/2022 00:07    Erick Blinks, MD  [facility]  If 7PM-7AM, please contact night-coverage [email or web address]  03/13/2022, 7:53 PM   LOS: 2 days

## 2022-03-13 NOTE — Progress Notes (Signed)
SLP Cancellation Note  Patient Details Name: Thomas Huber MRN: [id number] DOB: 04-03-1955   Cancelled treatment:       Reason Eval/Treat Not Completed: Fatigue/lethargy limiting ability to participate;Patient's level of consciousness. Will re-attempt later today as schedule permits. Thank you,  [name] H. Romie Levee, CCC-SLP Speech Language Pathologist    Georgetta Haber 03/13/2022, 11:19 AM

## 2022-03-13 NOTE — Progress Notes (Signed)
Physical Therapy Treatment Patient Details Name: Thomas Huber MRN: [id number] DOB: 06/05/1955 Today's Date: 03/13/2022   History of Present Illness Thomas Huber is a 66 y.o. male with medical history significant of Parkinson disease who presents to the emergency department via EMS due to possible stroke.  Patient was alert and oriented x 3, but was unable to provide history of why he came to the ED.  History was obtained from ED physician and ED medical record.  Per report, he was found unresponsive in his apartment complex last known well was unknown.  EMS was activated and arrival of EMS team, blood glucose check was greater than 100, patient was reported to have facial droop slurred speech and left arm weakness.  Patient has no known history of seizures.    PT Comments    Therapist is not co treating and due to safety unable to ambulate pt this session    Recommendations for follow up therapy are one component of a multi-disciplinary discharge planning process, led by the attending physician.  Recommendations may be updated based on patient status, additional functional criteria and insurance authorization.  Follow Up Recommendations  Acute inpatient rehab (3hours/day)SNF/IP rehab     Assistance Recommended at Discharge Intermittent Supervision/Assistance  Patient can return home with the following A lot of help with bathing/dressing/bathroom;A lot of help with walking and/or transfers;Help with stairs or ramp for entrance;Assistance with cooking/housework   Equipment Recommendations  None recommended by PT    Recommendations for Other Services       Precautions / Restrictions Precautions Precautions: Fall     Mobility  Bed Mobility Overal bed mobility: Needs Assistance Bed Mobility: Supine to Sit, Sit to Supine     Supine to sit: Min guard Sit to supine: Mod assist, Max assist   General bed mobility comments: increased time, labored movement    Transfers                    General transfer comment: Pt is not following commands consistently therfore for safety pt completed standing activity at bedside only    Ambulation/Gait Ambulation/Gait assistance: Min assist, Mod assist                       Cognition Arousal/Alertness: Awake/alert Behavior During Therapy: Restless, Impulsive Overall Cognitive Status: No family/caregiver present to determine baseline cognitive functioning                                 General Comments: requires frequent repeated verbal/tactile cueing for following instrucitons        Exercises General Exercises - Lower Extremity Ankle Circles/Pumps:  (unable to get pt to understand) Long Arc Quad: Both, 10 reps Heel Slides: Both, 5 reps Straight Leg Raises: Both, 5 reps Hip Flexion/Marching: Standing, Both, 10 reps (Pt would not raise thigh higher as instructed exercise looked more like lifting foot off the ground 1/2" than marching.) Mini-Sqauts: Both, 5 reps (side stepping at bedside x 2 RT)    General Comments        Pertinent Vitals/Pain Pain Assessment Faces Pain Scale: No hurt       Prior Function   I in apartment         PT Goals (current goals can now be found in the care plan section)      Frequency    Min 4X/week      PT Plan  Current plan remains appropriate    Co-evaluation                 End of Session Equipment Utilized During Treatment: Gait belt Activity Tolerance: Patient tolerated treatment well;Patient limited by fatigue Patient left: in bed;with call bell/phone within reach Nurse Communication: Mobility status PT Visit Diagnosis: Unsteadiness on feet (R26.81);Other abnormalities of gait and mobility (R26.89);Muscle weakness (generalized) (M62.81)     Time: 1330-1400 PT Time Calculation (min) (ACUTE ONLY): 30 min  Charges:  $Therapeutic Exercise: 23-37 mins                     Virgina Organ, PT CLT [phone]  03/13/2022,  2:11 PM

## 2022-03-14 DIAGNOSIS — I63511 Cerebral infarction due to unspecified occlusion or stenosis of right middle cerebral artery: Secondary | ICD-10-CM | POA: Diagnosis not present

## 2022-03-14 DIAGNOSIS — E538 Deficiency of other specified B group vitamins: Secondary | ICD-10-CM | POA: Diagnosis not present

## 2022-03-14 DIAGNOSIS — G20A1 Parkinson's disease without dyskinesia, without mention of fluctuations: Secondary | ICD-10-CM | POA: Diagnosis not present

## 2022-03-14 DIAGNOSIS — Z515 Encounter for palliative care: Secondary | ICD-10-CM | POA: Diagnosis not present

## 2022-03-14 DIAGNOSIS — Z7189 Other specified counseling: Secondary | ICD-10-CM

## 2022-03-14 DIAGNOSIS — G459 Transient cerebral ischemic attack, unspecified: Secondary | ICD-10-CM | POA: Diagnosis not present

## 2022-03-14 LAB — RENAL FUNCTION PANEL
Albumin: 3.4 g/dL — ABNORMAL LOW (ref 3.5–5.0)
Anion gap: 10 (ref 5–15)
BUN: 11 mg/dL (ref 8–23)
CO2: 22 mmol/L (ref 22–32)
Calcium: 8.3 mg/dL — ABNORMAL LOW (ref 8.9–10.3)
Chloride: 108 mmol/L (ref 98–111)
Creatinine, Ser: 0.76 mg/dL (ref 0.61–1.24)
GFR, Estimated: >60 mL/min (ref >60)
Glucose, Bld: 105 mg/dL — ABNORMAL HIGH (ref 70–99)
Phosphorus: 3.5 mg/dL (ref 2.5–4.6)
Potassium: 3.1 mmol/L — ABNORMAL LOW (ref 3.5–5.1)
Sodium: 140 mmol/L (ref 135–145)

## 2022-03-14 LAB — VITAMIN B1: Vitamin B1 (Thiamine): 134.7 nmol/L (ref 66.5–200.0)

## 2022-03-14 MED ORDER — LORAZEPAM 2 MG/ML IJ SOLN: 1.0000 mg | INTRAMUSCULAR | Status: DC | PRN

## 2022-03-14 MED ORDER — GLYCOPYRROLATE 0.2 MG/ML IJ SOLN
0.3000 mg | Freq: Three times a day (TID) | INTRAMUSCULAR | Status: DC
  Administered 2022-03-14 – 2022-03-17 (×8): 0.3 mg via INTRAVENOUS
  Filled 2022-03-14 (×8): qty 2

## 2022-03-14 MED ORDER — SODIUM CHLORIDE 0.9 % IV SOLN
3.0000 g | Freq: Three times a day (TID) | INTRAVENOUS | Status: DC
  Administered 2022-03-14 – 2022-03-17 (×9): 3 g via INTRAVENOUS
  Filled 2022-03-14 (×9): qty 8

## 2022-03-14 MED ORDER — POTASSIUM CHLORIDE 10 MEQ/100ML IV SOLN
10.0000 meq | INTRAVENOUS | Status: AC
  Administered 2022-03-14 (×4): 10 meq via INTRAVENOUS
  Filled 2022-03-14 (×4): qty 100

## 2022-03-14 MED ORDER — HALOPERIDOL LACTATE 5 MG/ML IJ SOLN
5.0000 mg | Freq: Four times a day (QID) | INTRAMUSCULAR | Status: DC | PRN
  Administered 2022-03-14: 5 mg via INTRAVENOUS
  Filled 2022-03-14: qty 1

## 2022-03-14 MED ORDER — HALOPERIDOL LACTATE 5 MG/ML IJ SOLN
2.0000 mg | Freq: Four times a day (QID) | INTRAMUSCULAR | Status: DC | PRN
  Administered 2022-03-16: 2 mg via INTRAVENOUS
  Filled 2022-03-14 (×2): qty 1

## 2022-03-14 NOTE — Progress Notes (Signed)
Pt's niece Waynetta Sandy) called because she just found out he was in the hospital. She tried calling but didn't get anyone. I told her I would take her number down and someone would be in touch with her tomorrow. Number is in pt's chart as a contact.

## 2022-03-14 NOTE — Progress Notes (Signed)
Inpatient Rehab Admissions Coordinator:   Guardianship pending, no caregiver identified to support CIR admission.  Will sign off at this time.   Estill Dooms, PT, DPT Admissions Coordinator [phone] 03/14/22  8:46 AM

## 2022-03-14 NOTE — Progress Notes (Signed)
PROGRESS NOTE  Thomas Huber YQI:[id number] DOB: 04-06-1955 DOA: 03/06/2022 PCP: Ignatius Specking, MD  Brief History:  66 year old male with a history of Parkinson's disease presenting when he was found unresponsive in his apartment.  The last known well time was unclear.  History was unobtainable because of the patient's encephalopathy.  History is obtained from review of the medical record.  Apparently, EMS noted the patient to have facial droop, slurred speech, left arm weakness.  On 03/11/2022 after arrival, the patient eventually became agitated and required sedation with IV Ativan.  An NG tube was subsequently placed because of his dysphagia.  Neurology was consulted.  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 In the ED, the patient has been afebrile and hemodynamically stable with oxygen saturation 95-90% room air.  WBC 9.0, hemoglobin 15.0, platelets 1 49,000.  Sodium 139, potassium 3.7, bicarbonate 25, serum creatinine 0.81.  LFTs were unremarkable. Notably, the patient did have a long history of alcohol dependence previously, but quit slightly over 1 year prior to this admission.   Assessment/Plan: Acute ischemic stroke with cytotoxic edema -Appreciate Neurology Consult -PT/OT evaluation pending CIR versus SNF -Speech therapy eval -CT brain--no acute abnormality -MRI brain--acute right MCA infarct with cytotoxic edema -MRA brain--right MCA M2 occlusion -Carotid Duplex--no hemodynamically significant stenosis -Echo--EF 60-65%, no WMA, grade 1 DD, no PFO -Will need 14-day event monitor after discharge -LDL--85 -HbA1C--5.2 -Antiplatelet--ASA 81 + Plavix 75 mg x 3 weeks, then ASA 81 alone  Acute metabolic encephalopathy -Secondary to ischemic stroke with cytotoxic edema and B12 deficiency -UA negative for pyuria -B12 --109>> supplementing -Folic acid 8.5 -Ammonia 24 -TSH 1.3  Dyslipidemia -Continue  statin -LDL 85  Parkinson's disease -Currently on Sinemet through NG tube  Alcohol dependence in remission -Start thiamine  Dysphagia -NG inserted -Speech therapy following to consider further evaluation -continue IVF  Goals of care -Currently has NG tube for meds -He has significant dysarthria and will likely not be able to swallow -May need to consider PEG tube placement -Unfortunately, his overall goals of care for this situation is not known and there does not appear to be any family involved at this time -Will request palliative care to help further assess goals of care -Discussed case with ethics, Dr. Lyn Hollingshead regarding appropriate next steps. -Fortunately, nursing notes indicate that patient's niece has tried to contact staff at the hospital.  Will try and reach out to her tomorrow to further address his goals of care.    Family Communication: No family available.  DSS contacted for guardianship  Consultants:  neurology  Code Status:  FULL   DVT Prophylaxis:  [place] Heparin   Procedures: As Listed in Progress Note Above  Antibiotics: None      Subjective: Patient was reportedly agitated this morning, combative.  He received Ativan prior to 7 AM and subsequently received Haldol.  Staff reports that he has been sleeping since then.  Objective: Vitals:   03/13/22 2251 03/14/22 0027 03/14/22 0505 03/14/22 2036  BP: 125/75 122/80 125/83   Pulse: 95 87 84   Resp: Temp: 99.3 F (37.4 C) 99.1 F (37.3 C) 98.4 F (36.9 C) (!) 102.3 F (39.1 C)  TempSrc:    Axillary  SpO2: 95%  95%   Weight:      Height:        Intake/Output Summary (  Last 24 hours) at 03/14/2022 2044 Last data filed at 03/14/2022 1500 Gross per 24 hour  Intake 1762.73 ml  Output 200 ml  Net 1562.73 ml   Weight change:  Exam:  General:  Pt is alert, follows commands appropriately, not in acute distress HEENT: No icterus, No thrush, No neck mass, [place]/AT, NG tube in  place Cardiovascular: RRR, S1/S2, no rubs, no gallops Respiratory: Bilateral rhonchi no wheezing.  Rhonchi Abdomen: Soft/+BS, non tender, non distended, no guarding Extremities: No edema, No lymphangitis, No petechiae, No rashes, no synovitis Neuro:  difficult exam since patient is lethargic    Data Reviewed: I have personally reviewed following labs and imaging studies Basic Metabolic Panel: Recent Labs  Lab 2022-03-22 2320 03/11/22 0616 03/12/22 0534 03/13/22 0402 03/14/22 0342  NA 138 139 140 140 140  K 3.7 3.7 4.1 3.3* 3.1*  CL 103 106 107 109 108  CO2 GLUCOSE 99 118* 114* 104* 105*  BUN CREATININE 1.09 0.81 0.93 0.72 0.76  CALCIUM 8.9 9.0 9.0 8.5* 8.3*  MG  --  2.1  --  2.0  --   PHOS  --  2.8  --   --  3.5   Liver Function Tests: Recent Labs  Lab 03/22/2022 2320 03/11/22 0616 03/14/22 0342  AST 17 16  --   ALT 10 12  --   ALKPHOS 63 61  --   BILITOT 1.0 1.2  --   PROT 7.5 7.0  --   ALBUMIN 4.2 3.8 3.4*   No results for input(s): "LIPASE", "AMYLASE" in the last 168 hours. Recent Labs  Lab 03/11/22 1033  AMMONIA 24   Coagulation Profile: Recent Labs  Lab Mar 22, 2022 2320  INR 1.0   CBC: Recent Labs  Lab 03-22-2022 2258 Mar 22, 2022 2320 03/11/22 0616 03/12/22 0534  WBC  --  9.2 9.0 9.0  NEUTROABS  --  6.3  --   --   HGB 15.0 14.3 15.0 15.0  HCT 44.0 41.7 43.8 44.2  MCV  --  90.1 89.8 90.8  PLT  --  176 170 149*   Cardiac Enzymes: Recent Labs  Lab 03/12/22 0534  CKTOTAL 494*   BNP: Invalid input(s): "POCBNP" CBG: Recent Labs  Lab 2022/03/22 2252 03/12/22 0803  GLUCAP 92 100*   HbA1C: No results for input(s): "HGBA1C" in the last 72 hours.  Urine analysis:    Component Value Date/Time   COLORURINE YELLOW 03/11/2022 0822   APPEARANCEUR CLEAR 03/11/2022 0822   LABSPEC 1.014 03/11/2022 0822   PHURINE 7.0 03/11/2022 0822   GLUCOSEU NEGATIVE 03/11/2022 0822   HGBUR SMALL (A) 03/11/2022 0822   BILIRUBINUR  NEGATIVE 03/11/2022 0822   KETONESUR 5 (A) 03/11/2022 0822   PROTEINUR NEGATIVE 03/11/2022 0822   NITRITE NEGATIVE 03/11/2022 0822   LEUKOCYTESUR NEGATIVE 03/11/2022 0822   Sepsis Labs: (procalcitonin:4,lacticidven:4) ) Recent Results (from the past 240 hour(s))  Resp panel by RT-PCR (RSV, Flu A&B, Covid) Anterior Nasal Swab     Status: None   Collection Time: 03/22/22 11:20 PM   Specimen: Anterior Nasal Swab  Result Value Ref Range Status   SARS Coronavirus 2 by RT PCR NEGATIVE NEGATIVE Final    Comment: (NOTE) SARS-CoV-2 target nucleic acids are NOT DETECTED.  The SARS-CoV-2 RNA is generally detectable in upper respiratory specimens during the acute phase of infection. The lowest concentration of SARS-CoV-2 viral copies this assay can detect is 138 copies/mL. A negative result does not  preclude SARS-Cov-2 infection and should not be used as the sole basis for treatment or other patient management decisions. A negative result may occur with  improper specimen collection/handling, submission of specimen other than nasopharyngeal swab, presence of viral mutation(s) within the areas targeted by this assay, and inadequate number of viral copies(<138 copies/mL). A negative result must be combined with clinical observations, patient history, and epidemiological information. The expected result is Negative.  Fact Sheet for Patients:  BloggerCourse.comhttps://www.fda.gov/media/152166/download  Fact Sheet for Healthcare Providers:  SeriousBroker.ithttps://www.fda.gov/media/152162/download  This test is no t yet approved or cleared by the [place] FDA and  has been authorized for detection and/or diagnosis of SARS-CoV-2 by FDA under an Emergency Use Authorization (EUA). This EUA will remain  in effect (meaning this test can be used) for the duration of the COVID-19 declaration under Section 564(b)(1) of the Act, 21 U.S.C.section 360bbb-3(b)(1), unless the authorization is terminated  or revoked  sooner.       Influenza A by PCR NEGATIVE NEGATIVE Final   Influenza B by PCR NEGATIVE NEGATIVE Final    Comment: (NOTE) The Xpert Xpress SARS-CoV-2/FLU/RSV plus assay is intended as an aid in the diagnosis of influenza from Nasopharyngeal swab specimens and should not be used as a sole basis for treatment. Nasal washings and aspirates are unacceptable for Xpert Xpress SARS-CoV-2/FLU/RSV testing.  Fact Sheet for Patients: BloggerCourse.comhttps://www.fda.gov/media/152166/download  Fact Sheet for Healthcare Providers: SeriousBroker.ithttps://www.fda.gov/media/152162/download  This test is not yet approved or cleared by the [place] FDA and has been authorized for detection and/or diagnosis of SARS-CoV-2 by FDA under an Emergency Use Authorization (EUA). This EUA will remain in effect (meaning this test can be used) for the duration of the COVID-19 declaration under Section 564(b)(1) of the Act, 21 U.S.C. section 360bbb-3(b)(1), unless the authorization is terminated or revoked.     Resp Syncytial Virus by PCR NEGATIVE NEGATIVE Final    Comment: (NOTE) Fact Sheet for Patients: BloggerCourse.comhttps://www.fda.gov/media/152166/download  Fact Sheet for Healthcare Providers: SeriousBroker.ithttps://www.fda.gov/media/152162/download  This test is not yet approved or cleared by the [place] FDA and has been authorized for detection and/or diagnosis of SARS-CoV-2 by FDA under an Emergency Use Authorization (EUA). This EUA will remain in effect (meaning this test can be used) for the duration of the COVID-19 declaration under Section 564(b)(1) of the Act, 21 U.S.C. section 360bbb-3(b)(1), unless the authorization is terminated or revoked.  Performed at [facility], [address] [place], [place] [address]   Culture, blood (Routine X 2) w Reflex to ID Panel     Status: None (Preliminary result)   Collection Time: 03/12/22 12:55 PM   Specimen: Right Antecubital; Blood  Result Value Ref Range Status   Specimen Description RIGHT  ANTECUBITAL  Final   Special Requests   Final    BOTTLES DRAWN AEROBIC AND ANAEROBIC Blood Culture adequate volume   Culture   Final    NO GROWTH 2 DAYS Performed at [facility], [address] [place], [place] [address]    Report Status PENDING  Incomplete  Culture, blood (Routine X 2) w Reflex to ID Panel     Status: None (Preliminary result)   Collection Time: 03/12/22 12:55 PM   Specimen: BLOOD RIGHT FOREARM  Result Value Ref Range Status   Specimen Description BLOOD RIGHT FOREARM  Final   Special Requests   Final    BOTTLES DRAWN AEROBIC AND ANAEROBIC Blood Culture adequate volume   Culture   Final    NO GROWTH 2 DAYS Performed at  [facility], [address] [place], [place] [address]    Report Status PENDING  Incomplete     Scheduled Meds:   stroke: early stages of recovery book   Does not apply Once   aspirin  300 mg Rectal Daily   atorvastatin  40 mg Per Tube Daily   carbidopa-levodopa  3 tablet Per Tube q morning   And   carbidopa-levodopa  2 tablet Per NG tube Q1200   And   carbidopa-levodopa  1 tablet Per Tube BID   clopidogrel  75 mg Per Tube Daily   vitamin B-12  1,000 mcg Per Tube Daily   glycopyrrolate  0.3 mg Intravenous TID   thiamine (VITAMIN B1) injection  100 mg Intravenous Daily   Continuous Infusions:  dextrose 5 % and 0.9% NaCl 50 mL/hr at 03/14/22 1426    Procedures/Studies: DG Chest Port 1 View  Result Date: 03/13/2022 CLINICAL DATA:  NG tube placement EXAM: PORTABLE CHEST 1 VIEW COMPARISON:  03/05/2022 FINDINGS: There is interval placement of NG tube with its tip in the region of antrum of the stomach. There is poor inspiration. There are no signs of pulmonary edema or focal pulmonary consolidation. Small linear densities are seen in right lower lung field. There is no pleural effusion or pneumothorax. IMPRESSION: Tip of enteric tube is seen in the distal antrum of the stomach. There are small linear densities in right lower lung field which may  suggest crowding of bronchovascular structures due to poor inspiration or subsegmental atelectasis. Electronically Signed   By: Ernie Avena M.D.   On: 03/13/2022 18:53   ECHOCARDIOGRAM COMPLETE  Result Date: 03/11/2022    ECHOCARDIOGRAM REPORT   Patient Name:   KEYLON LABELLE Date of Exam: 03/11/2022 Medical Rec #:  [id number]     Height:       67.0 in Accession #:    [id number]    Weight:       190.0 lb Date of Birth:  12-22-1955     BSA:          1.979 m Patient Age:    66 years      BP:           146/65 mmHg Patient Gender: M             HR:           70 bpm. [facility] Procedure: 2D Echo, Cardiac Doppler and Color Doppler Indications:    Stroke I63.9  History:        Patient has no prior history of Echocardiogram examinations.                 Stroke. Parkinson's disease.  Sonographer:    Celesta Gentile RCS Referring Phys: [id number] OLADAPO ADEFESO  Sonographer Comments: Technically difficult study due to patient not able to understand instructions and very difficult imaging windows. Could NOT image IVC or subcostal. IMPRESSIONS  1. Left ventricular ejection fraction, by estimation, is 60 to 65%. The left ventricle has normal function. The left ventricle has no regional wall motion abnormalities. Left ventricular diastolic parameters are consistent with Grade I diastolic dysfunction (impaired relaxation).  2. Right ventricular systolic function poorly visualized but appears to be normal. The right ventricular size is normal.  3. The mitral valve was not well visualized. No evidence of mitral valve regurgitation. No evidence of mitral stenosis.  4. The aortic valve is tricuspid. Aortic valve regurgitation is not visualized. No aortic stenosis is present.  Comparison(s): No prior Echocardiogram. FINDINGS  Left Ventricle: Left ventricular ejection fraction, by estimation, is 60 to 65%. The left ventricle has normal function. The left ventricle has no regional wall motion abnormalities.  Definity contrast agent was given IV to delineate the left ventricular  endocardial borders. The left ventricular internal cavity size was normal in size. There is no left ventricular hypertrophy. Left ventricular diastolic parameters are consistent with Grade I diastolic dysfunction (impaired relaxation). Right Ventricle: The right ventricular size is normal. Right vetricular wall thickness was not well visualized. Right ventricular systolic function poorly visualized but appears to be normal. Left Atrium: Left atrial size was normal in size. Right Atrium: Right atrial size was normal in size. Pericardium: There is no evidence of pericardial effusion. Mitral Valve: The mitral valve was not well visualized. No evidence of mitral valve regurgitation. No evidence of mitral valve stenosis. Tricuspid Valve: The tricuspid valve is not well visualized. Tricuspid valve regurgitation is not demonstrated. No evidence of tricuspid stenosis. Aortic Valve: The aortic valve is tricuspid. Aortic valve regurgitation is not visualized. No aortic stenosis is present. Pulmonic Valve: The pulmonic valve was not well visualized. Pulmonic valve regurgitation is not visualized. No evidence of pulmonic stenosis. Aorta: The aortic root is normal in size and structure. Venous: The inferior vena cava was not well visualized. IAS/Shunts: The interatrial septum was not assessed.  LEFT VENTRICLE PLAX 2D LVIDd:         4.10 cm   Diastology LVIDs:         2.40 cm   LV e' medial:    5.44 cm/s LV PW:         1.00 cm   LV E/e' medial:  10.2 LV IVS:        1.00 cm   LV e' lateral:   8.38 cm/s LVOT diam:     1.90 cm   LV E/e' lateral: 6.6 LV SV:         58 LV SV Index:   30 LVOT Area:     2.84 cm  RIGHT VENTRICLE TAPSE (M-mode): 2.1 cm LEFT ATRIUM           Index LA diam:      4.10 cm 2.07 cm/m LA Vol (A2C): 39.2 ml 19.81 ml/m LA Vol (A4C): 48.4 ml 24.46 ml/m  AORTIC VALVE LVOT Vmax:   104.00 cm/s LVOT Vmean:  68.200 cm/s LVOT VTI:    0.206 m   AORTA Ao Root diam: 3.00 cm MITRAL VALVE MV Area (PHT): 2.50 cm    SHUNTS MV Decel Time: 303 msec    Systemic VTI:  0.21 m MV E velocity: 55.70 cm/s  Systemic Diam: 1.90 cm MV A velocity: 87.80 cm/s MV E/A ratio:  0.63 Vishnu Priya Mallipeddi Electronically signed by Winfield Rast Mallipeddi Signature Date/Time: 03/11/2022/3:26:57 PM    Final    DG Abdomen 1 View  Result Date: 03/11/2022 CLINICAL DATA:  NG tube placement EXAM: ABDOMEN - 1 VIEW COMPARISON:  None Available. FINDINGS: Tip of enteric tube is seen in the region of distal antrum of the stomach. There is presence of gas in few nondilated small bowel loops in upper abdomen. Lower abdomen and pelvis are not included in the image. IMPRESSION: Tip of enteric tube is seen in the distal antrum of the stomach. Electronically Signed   By: Ernie Avena M.D.   On: 03/11/2022 13:15   US Carotid Bilateral  Result Date: 03/11/2022 CLINICAL DATA:  Acute right MCA territory cerebral infarction.  EXAM: BILATERAL CAROTID DUPLEX ULTRASOUND TECHNIQUE: Wallace Cullens scale imaging, color Doppler and duplex ultrasound were performed of bilateral carotid and vertebral arteries in the neck. COMPARISON:  None Available. FINDINGS: Criteria: Quantification of carotid stenosis is based on velocity parameters that correlate the residual internal carotid diameter with NASCET-based stenosis levels, using the diameter of the distal internal carotid lumen as the denominator for stenosis measurement. The following velocity measurements were obtained: RIGHT ICA:  58/16 cm/sec CCA:  83/11 cm/sec SYSTOLIC ICA/CCA RATIO:  0.7 ECA:  112 cm/sec LEFT ICA:  62/17 cm/sec CCA:  97/10 cm/sec SYSTOLIC ICA/CCA RATIO:  0.6 ECA:  93 cm/sec RIGHT CAROTID ARTERY: Mild amount of partially calcified plaque at the level of the carotid bulb and proximal right ICA. Estimated right ICA stenosis is less than 50%. RIGHT VERTEBRAL ARTERY: Antegrade flow with normal waveform and velocity. LEFT CAROTID ARTERY:  Minimal partially calcified plaque at the level of the left carotid bulb and proximal ICA. Estimated left ICA stenosis is less than 50% LEFT VERTEBRAL ARTERY: Antegrade flow with normal waveform and velocity. IMPRESSION: Mild amount of plaque at the level of both carotid bulbs and proximal internal carotid arteries, right greater than left. No significant carotid stenosis identified. Estimated bilateral ICA stenoses are less than 50%. Electronically Signed   By: Irish Lack M.D.   On: 03/11/2022 11:27   MR BRAIN WO CONTRAST  Addendum Date: 03/11/2022   ADDENDUM REPORT: 03/11/2022 10:53 ADDENDUM: Study discussed with Dr. Tanda Rockers in the ED by telephone on 03/11/2022 at 10:44 . Electronically Signed   By: Odessa Fleming M.D.   On: 03/11/2022 10:53   Result Date: 03/11/2022 CLINICAL DATA:  66 year old male altered mental status. Neurologic deficit. EXAM: MRI HEAD WITHOUT CONTRAST TECHNIQUE: Multiplanar, multiecho pulse sequences of the brain and surrounding structures were obtained without intravenous contrast. COMPARISON:  Head CT yesterday. FINDINGS: Brain: Confluent restricted diffusion in the right insula and frontal operculum in an area of about 3.5 cm (series 5, image 20), tracking cephalad to toward the superior frontal gyrus. T2 and FLAIR hyperintense Cytotoxic edema. No hemorrhagic transformation. No mass effect. No contralateral left hemisphere or posterior fossa restricted diffusion. No midline shift, mass effect, evidence of mass lesion, ventriculomegaly, extra-axial collection or acute intracranial hemorrhage. Cervicomedullary junction and pituitary are within normal limits. Outside of the 8 acutely affected parenchyma gray and white matter signal is largely normal for age. Tiny chronic infarct in the right cerebellum series 15, image 1. Probable tiny chronic lacunar infarct right caudate nucleus series 18, image 29. Vascular: Major intracranial vascular flow voids are preserved. See MRA today  reported separately. Skull and upper cervical spine: Negative. Visualized bone marrow signal is within normal limits. Orbits/sinuses: Negative orbits. Paranasal sinuses and mastoids are stable and well aerated. Other: Negative visible scalp and face. IMPRESSION: 1. Positive for acute Right MCA territory infarct, middle sylvian division. Cytotoxic edema with no hemorrhage or mass effect. 2. Minimal underlying chronic small vessel disease. 3. MRA reported separately. Electronically Signed: By: Odessa Fleming M.D. On: 03/11/2022 10:38   MR ANGIO HEAD WO CONTRAST  Result Date: 03/11/2022 CLINICAL DATA:  66 year old male altered mental status. Neurologic deficit. Right MCA infarct. EXAM: MRA HEAD WITHOUT CONTRAST TECHNIQUE: Angiographic images of the Circle of Willis were acquired using MRA technique without intravenous contrast. COMPARISON:  Head CT yesterday. Brain MRI today reported separately. FINDINGS: Anterior circulation: Intermittent mild motion artifact. Antegrade flow in both ICA siphons to the carotid termini. Patent MCA and ACA origins. Mild  to moderate bilateral supraclinoid ICA stenosis suspected. Anterior communicating artery and visible ACA branches are within normal limits, the right ACA A2 might be dominant. Left MCA M1 segment and bifurcation are patent. Right MCA M1 segment is patent, but there is occlusion of the M2 middle sylvian branch at its origin at the bifurcation on series 10, image 106. The posterior M2 branch remains patent. Posterior circulation: Antegrade flow in the posterior circulation, the distal right vertebral artery appears dominant and the left appears to terminates in PICA. No visible distal vertebral stenosis. Patent basilar artery without stenosis. Patent basilar tip, SCA and PCA origins. Posterior communicating arteries are diminutive or absent. Motion artifact of the affects bilateral PCA branch detail, proximal PCAs are patent. Anatomic variants: Dominant right vertebral artery  appears to supply the basilar. Right ACA A2 may be dominant. Other: MRI head at the same time is reported separately. IMPRESSION: 1. Positive for Right MCA M2 Occlusion: middle and/or anterior division branch origin is occluded at the MCA bifurcation. 2. 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 Study discussed with Dr. Tanda Rockers in the ED by telephone on 03/11/2022 at 10:44 . Electronically Signed   By: Odessa Fleming M.D.   On: 03/11/2022 10:53   DG Chest Port 1 View  Result Date: 03/11/2022 CLINICAL DATA:  Weakness EXAM: PORTABLE CHEST 1 VIEW COMPARISON:  None Available. FINDINGS: Lungs are clear.  No pleural effusion or pneumothorax. The heart is normal in size. IMPRESSION: No evidence of acute cardiopulmonary disease. Electronically Signed   By: Charline Bills M.D.   On: 03/11/2022 00:11   CT CERVICAL SPINE WO CONTRAST  Result Date: 03/11/2022 CLINICAL DATA:  Unresponsive EXAM: CT CERVICAL SPINE WITHOUT CONTRAST TECHNIQUE: Multidetector CT imaging of the cervical spine was performed without intravenous contrast. Multiplanar CT image reconstructions were also generated. RADIATION DOSE REDUCTION: This exam was performed according to the departmental dose-optimization program which includes automated exposure control, adjustment of the mA and/or kV according to patient size and/or use of iterative reconstruction technique. COMPARISON:  None Available. FINDINGS: Alignment: Normal cervical lordosis. Skull base and vertebrae: No acute fracture. No primary bone lesion or focal pathologic process. Soft tissues and spinal canal: No prevertebral fluid or swelling. No visible canal hematoma. Disc levels: Mild degenerative changes of the mid/lower cervical spine. Spinal canal is patent. Upper chest: Visualized lung apices are clear. Other: Visualized thyroid is unremarkable. IMPRESSION: No traumatic  injury of the cervical spine. Mild degenerative changes. Electronically Signed   By: Charline Bills M.D.   On: 03/11/2022 00:08   CT HEAD WO CONTRAST  Result Date: 03/11/2022 CLINICAL DATA:  Delirium. EXAM: CT HEAD WITHOUT CONTRAST TECHNIQUE: Contiguous axial images were obtained from the base of the skull through the vertex without intravenous contrast. RADIATION DOSE REDUCTION: This exam was performed according to the departmental dose-optimization program which includes automated exposure control, adjustment of the mA and/or kV according to patient size and/or use of iterative reconstruction technique. COMPARISON:  Head CT 03/18/2019 FINDINGS: Brain: No evidence of acute infarction, hemorrhage, hydrocephalus, extra-axial collection or mass lesion/mass effect. Vascular: No hyperdense vessel or unexpected calcification. Skull: Normal. Negative for fracture or focal lesion. Sinuses/Orbits: Patient is status post sinonasal surgery on the left. Mastoid air cells are clear. Chronic nasal bone fractures are present. Orbits are within normal limits. Other: None. IMPRESSION: No acute intracranial abnormality. Electronically Signed   By: Mcneil Sober.D.  On: 03/11/2022 00:07    Erick Blinks, MD  [facility]  If 7PM-7AM, please contact night-coverage [email or web address]  03/14/2022, 8:44 PM   LOS: 3 days

## 2022-03-14 NOTE — Progress Notes (Signed)
SLP Cancellation Note  Patient Details Name: Thomas Huber MRN: [id number] DOB: [birth year]   Cancelled treatment:       Reason Eval/Treat Not Completed: Fatigue/lethargy limiting ability to participate;Patient's level of consciousness; SLP unable to awaken Pt despite repositioning him and verbal/tactile cues. Pt with audible secretions and mouth breathing, suction at bedside. Elevated HOB. Pt's primary limitation to PO intake is level of alertness. SLP will check back.  Thank you,  Havery Moros, CCC-SLP [phone]    Thomas Huber 03/14/2022, 3:25 PM

## 2022-03-14 NOTE — Consult Note (Signed)
Palliative Care Consult Note                                  Date: 03/14/2022   Patient Name: Thomas Huber  DOB: [birth year]  MRN: [id number]  Age / Sex: 66 y.o., male  PCP: Glenda Chroman, MD Referring Physician: Kathie Dike, MD  Reason for Consultation: Establishing goals of care  HPI/Patient Profile: 66 y.o. male  with past medical history of Parkinson's disease presenting when he was found unresponsive in his apartment. He was admitted on 03/06/2022 with acute ischemic stroke with cytotoxic edema, acute metabolic encephalopathy, Parkinson's disease, dysphagia, and others.   PMT was consulted for [place] conversations.  Past Medical History:  Diagnosis Date   Parkinson disease    Tremor     Subjective:   This NP Walden Field reviewed medical records, received report from team, assessed the patient and then meet at the patient's bedside to discuss diagnosis, prognosis, GOC, EOL wishes disposition and options.  I met with the patient at the bedside.However he is essentially unresponsive and non-responsive at the time.   Concept of Palliative Care was introduced as specialized medical care for people and their families living with serious illness.  If focuses on providing relief from the symptoms and stress of a serious illness.  The goal is to improve quality of life for both the patient and the family. Values and goals of care important to patient and family were attempted to be elicited.  Created space and opportunity for patient  and family to explore thoughts and feelings regarding current medical situation   Natural trajectory and current clinical status were discussed. Questions and concerns addressed. Patient  encouraged to call with questions or concerns.    Patient/Family Understanding of Illness: Deferred  Life Review: Deferred  Patient Values: Deferred  Goals: Deferred  Today's Discussion: Today when I saw the  patient he is essentially unresponsive at this time, does not awaken to voice or touch. His lungs are noticeably rhodochrous, possibly aspirating his secretions. Nurse was at the bedside and suctioned the patient. Deep suctioning is difficult due to NG Tube.  I spoke with both the hospitalist and the [facility] social worker. A temporary DSS guardianship was ordered. However, this only allows for placement and not signififcant medical decisions. The patient has a cousin, but no contact info seems to be available for them. He has no family or others we are aware of to make medical decisions in his name.  I spoke with the hospitalist about the patient. I feel given his severe illness, it would be in his best interest to be a DNR and to consider transition to comfort care. We are working toward how to make these decisions in the absence of POA and the patient uable to make his decisions. May need to involve the hospital ethics committee.  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  Constitutional:      General: He is not in acute distress.    Appearance: He is ill-appearing.  HENT:     Head: Normocephalic and atraumatic.  Cardiovascular:     Rate and Rhythm:  Normal rate.  Pulmonary:     Effort: Pulmonary effort is normal. No respiratory distress.     Breath sounds: Rhonchi present.  Abdominal:     General: Abdomen is flat.     Palpations: Abdomen is soft.  Skin:    General: Skin is warm and dry.  Neurological:     Mental Status: He is unresponsive.     Vital Signs:  BP 125/83   Pulse 84   Temp 98.4 F (36.9 C)   Resp 18   Ht _0  (1.702 m)   Wt 86.2 kg   SpO2 95%   BMI 29.76 kg/m    Palliative Assessment/Data: 10%    Advanced Care Planning:   Existing Vynca/ACP Documentation: None  Primary Decision Maker: OTHER: Determining  Code Status/Advance Care Planning: Full code  Decisions/Changes to ACP: None today  Assessment & Plan:   Impression: 66 year old male with chronic conditions and acute presentation as described above. The patient is essentially unresponsive at this time, unable to make his own decisions due the mental status change. Unfortunately there are no family or other possible decision makers available. Temporary DSS guardianship ordered, but they are unable to make decisions about code status or medical decisions. Will continue to attempt to identify a decision maker, may need to involve ethics committee. Overall prognosis is quite poor.  SUMMARY OF RECOMMENDATIONS   Remain full code, full scope for now Attempt to identify any decisions makers May need to involve ethics committee, possible 2-physician DNR PMT will continue to advocate for the patient [place] discussions when decision-maker identified.  Symptom Management:  Per primary team PMT is available to assist as needed  Prognosis:  Unable to determine  Discharge Planning:  To Be Determined   Discussed with: Medical team, nursing team, [facility] team    Thank you for allowing [place] to participate in the care of [name] PMT will continue to support holistically.  Time Total: 60 min  Greater than 50%  of this time was spent counseling and coordinating care related to the above assessment and plan.  Signed by: Walden Field, NP Palliative Medicine Team  Team Phone # [phone] (Nights/Weekends)  03/14/2022, 1:05 PM

## 2022-03-14 NOTE — TOC Progression Note (Signed)
Transition of Care [facility]) - Progression Note    Patient Details  Name: Davinder Haff MRN: [id number] Date of Birth: January 23, 1956  [facility] CM/SW Contact  Villa Herb, [place] Phone Number: 03/14/2022, 12:09 PM  Clinical Narrative:    CSW updated that DSS has taken interim guardianship at this time. CSW updated that Merry Proud can be reached at DSS main number ext [phone]. Brandi to fax interim guardianship paperwork to CSW. CSW to place in pts chart on floor. TOC to follow.    Barriers to Discharge: Continued Medical Work up  Expected Discharge Plan and Services In-house Referral: Clinical Social Work     Living arrangements for the past 2 months: Apartment                                       Social Determinants of Health (SDOH) Interventions SDOH Screenings   Food Insecurity: No Food Insecurity (03/11/2022)  Housing: Low Risk  (03/11/2022)  Transportation Needs: No Transportation Needs (03/11/2022)  Utilities: Not At Risk (03/11/2022)  Tobacco Use: Low Risk  (03-18-22)    Readmission Risk Interventions     No data to display

## 2022-03-14 NOTE — Progress Notes (Signed)
   03/14/22 2036  Assess: MEWS Score  Temp (!) 102.3 F (39.1 C)  Level of Consciousness Responds to Voice  Assess: MEWS Score  MEWS Temp 2  MEWS Systolic 0  MEWS Pulse 0  MEWS RR 0  MEWS LOC 1  MEWS Score 3  MEWS Score Color Yellow  Assess: if the MEWS score is Yellow or Red  Were vital signs taken at a resting state? Yes  Focused Assessment No change from prior assessment  Does the patient meet 2 or more of the SIRS criteria? No  Treat  MEWS Interventions Administered prn meds/treatments  Take Vital Signs  Increase Vital Sign Frequency  Yellow: Q 2hr X 2 then Q 4hr X 2, if remains yellow, continue Q 4hrs  Escalate  MEWS: Escalate Yellow: discuss with charge nurse/RN and consider discussing with provider and RRT  Notify: Charge Nurse/RN  Name of Charge Nurse/RN Notified Edgar Frisk, RN  Date Charge Nurse/RN Notified 03/14/22  Time Charge Nurse/RN Notified 2043  Provider Notification  Provider Name/Title Dr. Genene Churn  Date Provider Notified 03/14/22  Time Provider Notified 2043  Notification Reason Change in status  Assess: SIRS CRITERIA  SIRS Temperature  1  SIRS Pulse 0  SIRS Respirations  0  SIRS WBC 0  SIRS Score Sum  1

## 2022-03-14 NOTE — Plan of Care (Addendum)
Pt tolerated all meds and care this shift without any complications. Q4 neuro checks completed and safety maintained.  9735 PRN Lorazepam given for agitation trying to take off mittens.   Mouth suctioned with thick white secretions. HOB at 38.

## 2022-03-14 NOTE — Progress Notes (Signed)
   03/14/22 2043  Assess: MEWS Score  BP 125/72  MAP (mmHg) 87  Pulse Rate (!) 108  Resp 19  SpO2 92 %  O2 Device Room Air  Assess: MEWS Score  MEWS Temp 2  MEWS Systolic 0  MEWS Pulse 1  MEWS RR 0  MEWS LOC 1  MEWS Score 4  MEWS Score Color Red  Assess: if the MEWS score is Yellow or Red  Were vital signs taken at a resting state? Yes  Focused Assessment No change from prior assessment  Does the patient meet 2 or more of the SIRS criteria? Yes  Take Vital Signs  Increase Vital Sign Frequency  Red: Q 1hr X 4 then Q 4hr X 4, if remains red, continue Q 4hrs  Escalate  MEWS: Escalate Red: discuss with charge nurse/RN and provider, consider discussing with RRT  Notify: Charge Nurse/RN  Name of Charge Nurse/RN Notified Bree Johnson, RN  Date Charge Nurse/RN Notified 03/14/22  Time Charge Nurse/RN Notified 2102  Provider Notification  Provider Name/Title Dr. Zierle-Ghosh  Date Provider Notified 03/14/22  Time Provider Notified 2045  Provider response No new orders (MD already wrote orders for recent MEWS)  Assess: SIRS CRITERIA  SIRS Temperature  1  SIRS Pulse 1  SIRS Respirations  0  SIRS WBC 0  SIRS Score Sum  2    

## 2022-03-15 ENCOUNTER — Inpatient Hospital Stay (HOSPITAL_COMMUNITY): Payer: Medicare Other

## 2022-03-15 DIAGNOSIS — I63511 Cerebral infarction due to unspecified occlusion or stenosis of right middle cerebral artery: Secondary | ICD-10-CM | POA: Diagnosis not present

## 2022-03-15 DIAGNOSIS — G459 Transient cerebral ischemic attack, unspecified: Secondary | ICD-10-CM | POA: Diagnosis not present

## 2022-03-15 DIAGNOSIS — Z7189 Other specified counseling: Secondary | ICD-10-CM | POA: Diagnosis not present

## 2022-03-15 DIAGNOSIS — Z515 Encounter for palliative care: Secondary | ICD-10-CM | POA: Diagnosis not present

## 2022-03-15 DIAGNOSIS — G20A1 Parkinson's disease without dyskinesia, without mention of fluctuations: Secondary | ICD-10-CM | POA: Diagnosis not present

## 2022-03-15 DIAGNOSIS — E538 Deficiency of other specified B group vitamins: Secondary | ICD-10-CM | POA: Diagnosis not present

## 2022-03-15 DIAGNOSIS — I639 Cerebral infarction, unspecified: Secondary | ICD-10-CM

## 2022-03-15 NOTE — Progress Notes (Signed)
Daily Progress Note   Patient Name: Thomas Huber       Date: 03/15/2022 DOB: [birth year]  Age: 66 y.o. MRN#: [id number] Attending Physician: Kathie Dike, MD Primary Care Physician: Thomas Chroman, MD Admit Date: 03/12/2022 Length of Stay: 4 days  Reason for Consultation/Follow-up: Establishing goals of care  HPI/Patient Profile:  66 y.o. male  with past medical history of Parkinson's disease presenting when he was found unresponsive in his apartment. He was admitted on 03/04/2022 with acute ischemic stroke with cytotoxic edema, acute metabolic encephalopathy, Parkinson's disease, dysphagia, and others.    PMT was consulted for [place] conversations.  Subjective:   Subjective: Chart Reviewed. Updates received. Patient Assessed. Created space and opportunity for patient  and family to explore thoughts and feelings regarding current medical situation.  Today's Discussion: Today I saw the patient at the bedside.  Although he is more awake/alert, he is unable to meaningfully communicate or make his wishes known.  He did follow commands squeezing his right hand, did not squeeze his left hand.  Present the bedside is the patient's cousin Thomas Huber and her husband Thomas Huber.  We went to the family conference room to have further discussions.  I began by assessing her understanding of his current situation.  They do not have extensive detail than what was discussed this morning with Thomas Huber.  I discussed that he was found unresponsive, brought to the emergency room and was substantially confused.  He was diagnosed with a right MCA stroke with cytotoxic edema.  Yesterday he was essentially unresponsive.  Today he is more awake but unable to meaningfully communicate.  They verbalized agreement with this assessment of his current situation.  We also discussed concern for inability to handle secretions judging by his gurgling and need for frequent suctioning.  They also noted that they have heard him gurgling quite  a bit.  We discussed that this puts him at high risk for aspiration, aspiration pneumonia, worsening infection, high risk for decompensation and death.  We had a discussion about his baseline.  Thomas Huber and Thomas Huber are pretty involved in the patient's life.  They take him to his doctors appointments, take him shopping for groceries, etc.  They state he is normally conversational although he is somewhat difficult to understand partially exacerbated by his stutter.  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  He did have some hallucinations 1 to 2 years ago but neurology change his medications and is improved.  He does have a pretty good appetite typically and quit drinking 1 year ago.  The patient's mother passed away 3 years ago from severe dementia.  We discussed given his significant illness, chronic illnesses, the desire to make wishes known.  We discussed the situation with temporary DSS guardianship and how they will not make CODE STATUS and other goals of care decisions, however because they are a temporary guardian then newly identified family also cannot make decisions.  However, DSS explained that medical decisions made on the patient's behalf by the medical team should be after consideration of family input.  The patient's family, who again have been significantly involved in his life and know him quite well, state that they are completely in agreement with making the patient a DNR and not placing a PEG/feeding tube.  They do not think either of these will help his current situation.  They are also fully in support of comfort  care for the patient and would make this decision for him if they were able.  Finally I discussed the possibility that DSS would reach out to them and ask if they would like to volunteer to be his official guardian named in a hearing scheduled for January 4.  They agree that they would be  accepting of this appointment.  I informed them that I would ask TOC to reach out to them given that they have questions about the whole DSS and guardianship process.  I provided emotional and general support through therapeutic listening, empathy, sharing of stories, and other techniques. I answered all questions and addressed all concerns to the best of my ability.  Later today I reached out to the hospitalist Dr. Roderic Palau and had a discussion about the conversation with the family.  He states that he will reach out to other involved parties/medical team and will also call the family back to confirm there thoughts.  Review of Systems  Unable to perform ROS: Acuity of condition    Objective:   Vital Signs:  BP 123/67 (BP Location: Left Arm)   Pulse (!) 105   Temp 99.5 F (37.5 C) (Oral)   Resp 20   Ht 5\' 7"  (1.702 m)   Wt 86.2 kg   SpO2 92%   BMI 29.76 kg/m   Physical Exam: Physical Exam Vitals and nursing note reviewed.  Constitutional:      General: He is not in acute distress.    Appearance: He is ill-appearing.  HENT:     Head: Normocephalic and atraumatic.  Cardiovascular:     Rate and Rhythm: Normal rate.  Pulmonary:     Effort: Pulmonary effort is normal. No respiratory distress.     Breath sounds: Rhonchi present.     Comments: Noted gurgling of secretions Skin:    General: Skin is warm and dry.  Neurological:     Mental Status: He is alert.     Comments: Non-communicative     Palliative Assessment/Data: 10%    Existing Vynca/ACP Documentation: None  Assessment & Plan:   Impression: Present on Admission:  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 ([facility])  Parkinson's disease  B12 deficiency  CVA (cerebral vascular accident) ([place])  66 year old male with chronic conditions and acute presentation as described above. The patient is essentially unresponsive at this time, unable to make his own decisions due the  mental status change.  Newly identified family extensively involved in the patient's life, meeting today as per above.  Temporary DSS guardianship ordered, but they are unable to make decisions about code status or medical decisions.  However, they also note because they are guardian then newly identified family is not able to make decisions he either.  Working on resolution on the most appropriate way to make decisions for the patient until such time as he may be able to do so himself or a permanent guardian is appointed.  Overall prognosis is quite poor.   SUMMARY OF RECOMMENDATIONS   Remain full code, full scope for now Continue discussions with family May need to consider two-physician decision making PMT will continue to advocate for the patient PMT will continue to follow  Symptom Management:  Per primary team PMT is available to assist as needed  Code Status: Full code  Prognosis: Unable to determine  Discharge Planning: To Be Determined  Discussed with: Medical team, nursing team, patient's family, TOC team  Thank you for allowing  [place] to participate in the care of Hameed Kolar PMT will continue to support holistically.  Time Total: 75 min  Visit consisted of counseling and education dealing with the complex and emotionally intense issues of symptom management and palliative care in the setting of serious and potentially life-threatening illness. Greater than 50%  of this time was spent counseling and coordinating care related to the above assessment and plan.  Wynne Dust, NP Palliative Medicine Team  Team Phone # [phone] (Nights/Weekends)  11/21/2020, 8:17 AM

## 2022-03-15 NOTE — Progress Notes (Signed)
PROGRESS NOTE  Thomas Huber BZJ:[id number] DOB: November 23, 1955 DOA: 03/16/2022 PCP: Thomas Specking, MD  Brief History:  66 year old male with a history of Parkinson's disease presenting when he was found unresponsive in his apartment.  The last known well time was unclear.  History was unobtainable because of the patient's encephalopathy.  History is obtained from review of the medical record.  Apparently, EMS noted the patient to have facial droop, slurred speech, left arm weakness.  On 03/11/2022 after arrival, the patient eventually became agitated and required sedation with IV Ativan.  An NG tube was subsequently placed because of his dysphagia.  Neurology was consulted.  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 In the ED, the patient has been afebrile and hemodynamically stable with oxygen saturation 95-90% room air.  WBC 9.0, hemoglobin 15.0, platelets 1 49,000.  Sodium 139, potassium 3.7, bicarbonate 25, serum creatinine 0.81.  LFTs were unremarkable. Notably, the patient did have a long history of alcohol dependence previously, but quit slightly over 1 year prior to this admission.   Assessment/Plan: Acute ischemic stroke with cytotoxic edema -Appreciate Neurology Consult -PT/OT evaluation pending CIR versus SNF -Speech therapy eval -CT brain--no acute abnormality -MRI brain--acute right MCA infarct with cytotoxic edema -MRA brain--right MCA M2 occlusion -Carotid Duplex--no hemodynamically significant stenosis -Echo--EF 60-65%, no WMA, grade 1 DD, no PFO -Will need 14-day event monitor after discharge -LDL--85 -HbA1C--5.2 -Antiplatelet--ASA 81 + Plavix 75 mg x 3 weeks, then ASA 81 alone  Acute metabolic encephalopathy -Secondary to ischemic stroke with cytotoxic edema and B12 deficiency -UA negative for pyuria -B12 --109>> supplementing -Folic acid 8.5 -Ammonia 24 -TSH 1.3  Dyslipidemia -Continue  statin -LDL 85  Parkinson's disease -Currently on Sinemet through NG tube  Alcohol dependence in remission -Start thiamine  Dysphagia -NG inserted -Patient noted to be very rhonchorous/gurgling despite not getting any significant tube feeding.  Suspect he is aspirating his oral secretions -Speech to be following, but they have not been able to perform an evaluation to assess his swallowing as of yet.  Primary limitation with his mental status -Hopefully his mental status does appear to be better, speech therapy can assess his swallowing and potentially start a diet if he passes.  Fever Likely aspiration pneumonia -Noted to be febrile last night with increased respiratory secretions -He has been started on Unasyn -Blood cultures have been sent -Will repeat chest x-ray -Continue frequent suctioning  Goals of care -Currently has NG tube for meds -He has significant dysphagia and is currently unable to swallow.  Suspect that he also may be aspirating his oral secretions -Initially, no family available to address further goals/decision making and therefore DSS started interim guardianship -Yesterday, patient's cousin called to check on him and is now engaged in his care.  His cousin Thomas Huber reports that her and her husband were very involved in patient's care prior to his admission.  They would take him to his doctors appointments.  In fact he had stayed with them for approximately a month after his mother's death.  They relate that Thomas Huber truly evaluated his independence which is why he was still living independently with Thomas Huber and her husband frequently checking on him.  Kim feels certain that Thomas Huber would not want any aggressive measures such as intubation or CPR at the end of his life.  She is strongly in support of DNR. -I reviewed  the case with palliative care Thomas Huber whose team is also been following.  Please refer to her note.  Considering patient's current mental status, neurologic  deficits, high risk for complications such as recurrent aspiration pneumonia and underlying Parkinson's disease, I agree that CPR and intubation at the time of his death would inflict pain and suffering and unlikely provide any meaningful medical benefit.  I agree with Thomas Huber opinion that DNR would be appropriate in this situation.  His family also agrees with this recommendation and feel as though the patient would want the same -Patient will likely need to be monitored in the hospital to see if he has any further neurologic improvement and his speech therapy is able to work with him to start him on a diet prior to considering any further intervention/disposition    Family Communication: Discussed with patient's cousin Thomas Huber.  DSS has filed for interim guardianship.  Consultants:  neurology, palliative care  Code Status: DNR  DVT Prophylaxis:  [place] Heparin   Procedures: As Listed in Progress Note Above  Antibiotics: Unasyn 12/21 >      Subjective: He is sitting up in bed today.  When asked the year, he is able to mouth 2023.  Unable to comprehend further speech due to significant aphasia.  Noted to have high fever last night with increased respiratory secretions.  Objective: Vitals:   03/15/22 0000 03/15/22 0431 03/15/22 1000 03/15/22 1439  BP: (!) 104/57 (!) 133/59 123/67 131/67  Pulse: 88 93 (!) 105 92  Resp: (!) 32 (!) Temp: 99.9 F (37.7 C) 97.8 F (36.6 C) 99.5 F (37.5 C) 99.2 F (37.3 C)  TempSrc:   Oral Axillary  SpO2: 92% 93% 92% 98%  Weight:      Height:        Intake/Output Summary (Last 24 hours) at 03/15/2022 1844 Last data filed at 03/15/2022 1300 Gross per 24 hour  Intake 670.35 ml  Output 1200 ml  Net -529.65 ml   Weight change:  Exam:  General:  Pt is alert, sitting up in bed, able to follow some simple commands HEENT: No icterus, No thrush, No neck mass, [place]/AT, NG tube in place Cardiovascular: RRR, S1/S2, no rubs, no  gallops Respiratory: Lungs are clear today, recently suctioned Abdomen: Soft/+BS, non tender, non distended, no guarding Extremities: No edema, No lymphangitis, No petechiae, No rashes, no synovitis Neuro: He does raise his right arm and right leg when asked to do so.  When asked to move his right arm and right leg, these are significantly weaker, but he is able to move them    Data Reviewed: I have personally reviewed following labs and imaging studies Basic Metabolic Panel: Recent Labs  Lab Mar 12, 2022 2320 03/11/22 0616 03/12/22 0534 03/13/22 0402 03/14/22 0342  NA 138 139 140 140 140  K 3.7 3.7 4.1 3.3* 3.1*  CL 103 106 107 109 108  CO2 GLUCOSE 99 118* 114* 104* 105*  BUN CREATININE 1.09 0.81 0.93 0.72 0.76  CALCIUM 8.9 9.0 9.0 8.5* 8.3*  MG  --  2.1  --  2.0  --   PHOS  --  2.8  --   --  3.5   Liver Function Tests: Recent Labs  Lab 2022-03-12 2320 03/11/22 0616 03/14/22 0342  AST 17 16  --   ALT 10 12  --   ALKPHOS 63 61  --   BILITOT  1.0 1.2  --   PROT 7.5 7.0  --   ALBUMIN 4.2 3.8 3.4*   No results for input(s): "LIPASE", "AMYLASE" in the last 168 hours. Recent Labs  Lab 03/11/22 1033  AMMONIA 24   Coagulation Profile: Recent Labs  Lab 03/12/2022 2320  INR 1.0   CBC: Recent Labs  Lab 03/20/2022 2258 02/27/2022 2320 03/11/22 0616 03/12/22 0534  WBC  --  9.2 9.0 9.0  NEUTROABS  --  6.3  --   --   HGB 15.0 14.3 15.0 15.0  HCT 44.0 41.7 43.8 44.2  MCV  --  90.1 89.8 90.8  PLT  --  176 170 149*   Cardiac Enzymes: Recent Labs  Lab 03/12/22 0534  CKTOTAL 494*   BNP: Invalid input(s): "POCBNP" CBG: Recent Labs  Lab 02/27/2022 2252 03/12/22 0803  GLUCAP 92 100*   HbA1C: No results for input(s): "HGBA1C" in the last 72 hours.  Urine analysis:    Component Value Date/Time   COLORURINE YELLOW 03/11/2022 0822   APPEARANCEUR CLEAR 03/11/2022 0822   LABSPEC 1.014 03/11/2022 0822   PHURINE 7.0 03/11/2022 0822    GLUCOSEU NEGATIVE 03/11/2022 0822   HGBUR SMALL (A) 03/11/2022 0822   BILIRUBINUR NEGATIVE 03/11/2022 0822   KETONESUR 5 (A) 03/11/2022 0822   PROTEINUR NEGATIVE 03/11/2022 0822   NITRITE NEGATIVE 03/11/2022 0822   LEUKOCYTESUR NEGATIVE 03/11/2022 0822   Sepsis Labs: (procalcitonin:4,lacticidven:4) ) Recent Results (from the past 240 hour(s))  Resp panel by RT-PCR (RSV, Flu A&B, Covid) Anterior Nasal Swab     Status: None   Collection Time: 03/11/2022 11:20 PM   Specimen: Anterior Nasal Swab  Result Value Ref Range Status   SARS Coronavirus 2 by RT PCR NEGATIVE NEGATIVE Final    Comment: (NOTE) SARS-CoV-2 target nucleic acids are NOT DETECTED.  The SARS-CoV-2 RNA is generally detectable in upper respiratory specimens during the acute phase of infection. The lowest concentration of SARS-CoV-2 viral copies this assay can detect is 138 copies/mL. A negative result does not preclude SARS-Cov-2 infection and should not be used as the sole basis for treatment or other patient management decisions. A negative result may occur with  improper specimen collection/handling, submission of specimen other than nasopharyngeal swab, presence of viral mutation(s) within the areas targeted by this assay, and inadequate number of viral copies(<138 copies/mL). A negative result must be combined with clinical observations, patient history, and epidemiological information. The expected result is Negative.  Fact Sheet for Patients:  BloggerCourse.com  Fact Sheet for Healthcare Providers:  SeriousBroker.it  This test is no t yet approved or cleared by the [place] FDA and  has been authorized for detection and/or diagnosis of SARS-CoV-2 by FDA under an Emergency Use Authorization (EUA). This EUA will remain  in effect (meaning this test can be used) for the duration of the COVID-19 declaration under Section 564(b)(1) of the Act,  21 U.S.C.section 360bbb-3(b)(1), unless the authorization is terminated  or revoked sooner.       Influenza A by PCR NEGATIVE NEGATIVE Final   Influenza B by PCR NEGATIVE NEGATIVE Final    Comment: (NOTE) The Xpert Xpress SARS-CoV-2/FLU/RSV plus assay is intended as an aid in the diagnosis of influenza from Nasopharyngeal swab specimens and should not be used as a sole basis for treatment. Nasal washings and aspirates are unacceptable for Xpert Xpress SARS-CoV-2/FLU/RSV testing.  Fact Sheet for Patients: BloggerCourse.com  Fact Sheet for Healthcare Providers: SeriousBroker.it  This test is not yet approved or cleared by  the [employer] and has been authorized for detection and/or diagnosis of SARS-CoV-2 by FDA under an Emergency Use Authorization (EUA). This EUA will remain in effect (meaning this test can be used) for the duration of the COVID-19 declaration under Section 564(b)(1) of the Act, 21 U.S.C. section 360bbb-3(b)(1), unless the authorization is terminated or revoked.     Resp Syncytial Virus by PCR NEGATIVE NEGATIVE Final    Comment: (NOTE) Fact Sheet for Patients: BloggerCourse.com  Fact Sheet for Healthcare Providers: SeriousBroker.it  This test is not yet approved or cleared by the [place] FDA and has been authorized for detection and/or diagnosis of SARS-CoV-2 by FDA under an Emergency Use Authorization (EUA). This EUA will remain in effect (meaning this test can be used) for the duration of the COVID-19 declaration under Section 564(b)(1) of the Act, 21 U.S.C. section 360bbb-3(b)(1), unless the authorization is terminated or revoked.  Performed at [facility], [address] [place], [place] [address]   Culture, blood (Routine X 2) w Reflex to ID Panel     Status: None (Preliminary result)   Collection Time: 03/12/22 12:55 PM   Specimen:  Right Antecubital; Blood  Result Value Ref Range Status   Specimen Description RIGHT ANTECUBITAL  Final   Special Requests   Final    BOTTLES DRAWN AEROBIC AND ANAEROBIC Blood Culture adequate volume   Culture   Final    NO GROWTH 3 DAYS Performed at [facility], [address], [place], [place] [address]    Report Status PENDING  Incomplete  Culture, blood (Routine X 2) w Reflex to ID Panel     Status: None (Preliminary result)   Collection Time: 03/12/22 12:55 PM   Specimen: BLOOD RIGHT FOREARM  Result Value Ref Range Status   Specimen Description BLOOD RIGHT FOREARM  Final   Special Requests   Final    BOTTLES DRAWN AEROBIC AND ANAEROBIC Blood Culture adequate volume   Culture   Final    NO GROWTH 3 DAYS Performed at [facility], [address], [place], [place] [address]    Report Status PENDING  Incomplete  Culture, blood (Routine X 2) w Reflex to ID Panel     Status: None (Preliminary result)   Collection Time: 03/14/22  9:03 PM   Specimen: BLOOD  Result Value Ref Range Status   Specimen Description BLOOD BLOOD RIGHT ARM  Final   Special Requests   Final    BOTTLES DRAWN AEROBIC AND ANAEROBIC Blood Culture adequate volume   Culture   Final    NO GROWTH < 12 HOURS Performed at [facility], [address] [place], [place] [address]    Report Status PENDING  Incomplete  Culture, blood (Routine X 2) w Reflex to ID Panel     Status: None (Preliminary result)   Collection Time: 03/14/22  9:05 PM   Specimen: BLOOD  Result Value Ref Range Status   Specimen Description BLOOD BLOOD RIGHT ARM  Final   Special Requests   Final    BOTTLES DRAWN AEROBIC AND ANAEROBIC Blood Culture results may not be optimal due to an excessive volume of blood received in culture bottles   Culture   Final    NO GROWTH < 12 HOURS Performed at [facility], [address] [place], [place] [address]    Report Status PENDING  Incomplete     Scheduled Meds:   stroke: early stages of recovery book    Does not apply Once   aspirin  300 mg Rectal Daily   atorvastatin  40 mg Per Tube Daily   carbidopa-levodopa  3 tablet Per Tube q morning   And   carbidopa-levodopa  2 tablet Per NG tube Q1200   And   carbidopa-levodopa  1 tablet Per Tube BID   clopidogrel  75 mg Per Tube Daily   vitamin B-12  1,000 mcg Per Tube Daily   glycopyrrolate  0.3 mg Intravenous TID   thiamine (VITAMIN B1) injection  100 mg Intravenous Daily   Continuous Infusions:  ampicillin-sulbactam (UNASYN) IV 3 g (03/15/22 1317)   dextrose 5 % and 0.9% NaCl 50 mL/hr at 03/14/22 1426    Procedures/Studies: DG Chest Port 1 View  Result Date: 03/13/2022 CLINICAL DATA:  NG tube placement EXAM: PORTABLE CHEST 1 VIEW COMPARISON:  04/09/2022 FINDINGS: There is interval placement of NG tube with its tip in the region of antrum of the stomach. There is poor inspiration. There are no signs of pulmonary edema or focal pulmonary consolidation. Small linear densities are seen in right lower lung field. There is no pleural effusion or pneumothorax. IMPRESSION: Tip of enteric tube is seen in the distal antrum of the stomach. There are small linear densities in right lower lung field which may suggest crowding of bronchovascular structures due to poor inspiration or subsegmental atelectasis. Electronically Signed   By: Ernie Avena M.D.   On: 03/13/2022 18:53   ECHOCARDIOGRAM COMPLETE  Result Date: 03/11/2022    ECHOCARDIOGRAM REPORT   Patient Name:   RAMEZ ARRONA Date of Exam: 03/11/2022 Medical Rec #:  [id number]     Height:       67.0 in Accession #:    [id number]    Weight:       190.0 lb Date of Birth:  October 24, 1955     BSA:          1.979 m Patient Age:    66 years      BP:           146/65 mmHg Patient Gender: M             HR:           70 bpm. [facility] Procedure: 2D Echo, Cardiac Doppler and Color Doppler Indications:    Stroke I63.9  History:        Patient has no prior history of Echocardiogram  examinations.                 Stroke. Parkinson's disease.  Sonographer:    Celesta Gentile RCS Referring Phys: [id number] OLADAPO ADEFESO  Sonographer Comments: Technically difficult study due to patient not able to understand instructions and very difficult imaging windows. Could NOT image IVC or subcostal. IMPRESSIONS  1. Left ventricular ejection fraction, by estimation, is 60 to 65%. The left ventricle has normal function. The left ventricle has no regional wall motion abnormalities. Left ventricular diastolic parameters are consistent with Grade I diastolic dysfunction (impaired relaxation).  2. Right ventricular systolic function poorly visualized but appears to be normal. The right ventricular size is normal.  3. The mitral valve was not well visualized. No evidence of mitral valve regurgitation. No evidence of mitral stenosis.  4. The aortic valve is tricuspid. Aortic valve regurgitation is not visualized. No aortic stenosis is present. Comparison(s): No prior Echocardiogram. FINDINGS  Left Ventricle: Left ventricular ejection fraction, by estimation, is 60 to 65%. The left ventricle has normal function. The left ventricle has no regional wall motion  abnormalities. Definity contrast agent was given IV to delineate the left ventricular  endocardial borders. The left ventricular internal cavity size was normal in size. There is no left ventricular hypertrophy. Left ventricular diastolic parameters are consistent with Grade I diastolic dysfunction (impaired relaxation). Right Ventricle: The right ventricular size is normal. Right vetricular wall thickness was not well visualized. Right ventricular systolic function poorly visualized but appears to be normal. Left Atrium: Left atrial size was normal in size. Right Atrium: Right atrial size was normal in size. Pericardium: There is no evidence of pericardial effusion. Mitral Valve: The mitral valve was not well visualized. No evidence of mitral valve regurgitation.  No evidence of mitral valve stenosis. Tricuspid Valve: The tricuspid valve is not well visualized. Tricuspid valve regurgitation is not demonstrated. No evidence of tricuspid stenosis. Aortic Valve: The aortic valve is tricuspid. Aortic valve regurgitation is not visualized. No aortic stenosis is present. Pulmonic Valve: The pulmonic valve was not well visualized. Pulmonic valve regurgitation is not visualized. No evidence of pulmonic stenosis. Aorta: The aortic root is normal in size and structure. Venous: The inferior vena cava was not well visualized. IAS/Shunts: The interatrial septum was not assessed.  LEFT VENTRICLE PLAX 2D LVIDd:         4.10 cm   Diastology LVIDs:         2.40 cm   LV e' medial:    5.44 cm/s LV PW:         1.00 cm   LV E/e' medial:  10.2 LV IVS:        1.00 cm   LV e' lateral:   8.38 cm/s LVOT diam:     1.90 cm   LV E/e' lateral: 6.6 LV SV:         58 LV SV Index:   30 LVOT Area:     2.84 cm  RIGHT VENTRICLE TAPSE (M-mode): 2.1 cm LEFT ATRIUM           Index LA diam:      4.10 cm 2.07 cm/m LA Vol (A2C): 39.2 ml 19.81 ml/m LA Vol (A4C): 48.4 ml 24.46 ml/m  AORTIC VALVE LVOT Vmax:   104.00 cm/s LVOT Vmean:  68.200 cm/s LVOT VTI:    0.206 m  AORTA Ao Root diam: 3.00 cm MITRAL VALVE MV Area (PHT): 2.50 cm    SHUNTS MV Decel Time: 303 msec    Systemic VTI:  0.21 m MV E velocity: 55.70 cm/s  Systemic Diam: 1.90 cm MV A velocity: 87.80 cm/s MV E/A ratio:  0.63 Vishnu Priya Mallipeddi Electronically signed by Winfield Rast Mallipeddi Signature Date/Time: 03/11/2022/3:26:57 PM    Final    DG Abdomen 1 View  Result Date: 03/11/2022 CLINICAL DATA:  NG tube placement EXAM: ABDOMEN - 1 VIEW COMPARISON:  None Available. FINDINGS: Tip of enteric tube is seen in the region of distal antrum of the stomach. There is presence of gas in few nondilated small bowel loops in upper abdomen. Lower abdomen and pelvis are not included in the image. IMPRESSION: Tip of enteric tube is seen in the distal antrum  of the stomach. Electronically Signed   By: Ernie Avena M.D.   On: 03/11/2022 13:15   US Carotid Bilateral  Result Date: 03/11/2022 CLINICAL DATA:  Acute right MCA territory cerebral infarction. EXAM: BILATERAL CAROTID DUPLEX ULTRASOUND TECHNIQUE: Wallace Cullens scale imaging, color Doppler and duplex ultrasound were performed of bilateral carotid and vertebral arteries in the neck. COMPARISON:  None Available. FINDINGS: Criteria: Quantification  of carotid stenosis is based on velocity parameters that correlate the residual internal carotid diameter with NASCET-based stenosis levels, using the diameter of the distal internal carotid lumen as the denominator for stenosis measurement. The following velocity measurements were obtained: RIGHT ICA:  58/16 cm/sec CCA:  83/11 cm/sec SYSTOLIC ICA/CCA RATIO:  0.7 ECA:  112 cm/sec LEFT ICA:  62/17 cm/sec CCA:  97/10 cm/sec SYSTOLIC ICA/CCA RATIO:  0.6 ECA:  93 cm/sec RIGHT CAROTID ARTERY: Mild amount of partially calcified plaque at the level of the carotid bulb and proximal right ICA. Estimated right ICA stenosis is less than 50%. RIGHT VERTEBRAL ARTERY: Antegrade flow with normal waveform and velocity. LEFT CAROTID ARTERY: Minimal partially calcified plaque at the level of the left carotid bulb and proximal ICA. Estimated left ICA stenosis is less than 50% LEFT VERTEBRAL ARTERY: Antegrade flow with normal waveform and velocity. IMPRESSION: Mild amount of plaque at the level of both carotid bulbs and proximal internal carotid arteries, right greater than left. No significant carotid stenosis identified. Estimated bilateral ICA stenoses are less than 50%. Electronically Signed   By: Irish LackGlenn  Yamagata M.D.   On: 03/11/2022 11:27   MR BRAIN WO CONTRAST  Addendum Date: 03/11/2022   ADDENDUM REPORT: 03/11/2022 10:53 ADDENDUM: Study discussed with Dr. Tanda RockersSamuel Gray in the ED by telephone on 03/11/2022 at 10:44 . Electronically Signed   By: Odessa FlemingH  Hall M.D.   On: 03/11/2022 10:53    Result Date: 03/11/2022 CLINICAL DATA:  66 year old male altered mental status. Neurologic deficit. EXAM: MRI HEAD WITHOUT CONTRAST TECHNIQUE: Multiplanar, multiecho pulse sequences of the brain and surrounding structures were obtained without intravenous contrast. COMPARISON:  Head CT yesterday. FINDINGS: Brain: Confluent restricted diffusion in the right insula and frontal operculum in an area of about 3.5 cm (series 5, image 20), tracking cephalad to toward the superior frontal gyrus. T2 and FLAIR hyperintense Cytotoxic edema. No hemorrhagic transformation. No mass effect. No contralateral left hemisphere or posterior fossa restricted diffusion. No midline shift, mass effect, evidence of mass lesion, ventriculomegaly, extra-axial collection or acute intracranial hemorrhage. Cervicomedullary junction and pituitary are within normal limits. Outside of the 8 acutely affected parenchyma gray and white matter signal is largely normal for age. Tiny chronic infarct in the right cerebellum series 15, image 1. Probable tiny chronic lacunar infarct right caudate nucleus series 18, image 29. Vascular: Major intracranial vascular flow voids are preserved. See MRA today reported separately. Skull and upper cervical spine: Negative. Visualized bone marrow signal is within normal limits. Orbits/sinuses: Negative orbits. Paranasal sinuses and mastoids are stable and well aerated. Other: Negative visible scalp and face. IMPRESSION: 1. Positive for acute Right MCA territory infarct, middle sylvian division. Cytotoxic edema with no hemorrhage or mass effect. 2. Minimal underlying chronic small vessel disease. 3. MRA reported separately. Electronically Signed: By: Odessa FlemingH  Hall M.D. On: 03/11/2022 10:38   MR ANGIO HEAD WO CONTRAST  Result Date: 03/11/2022 CLINICAL DATA:  66 year old male altered mental status. Neurologic deficit. Right MCA infarct. EXAM: MRA HEAD WITHOUT CONTRAST TECHNIQUE: Angiographic images of the Circle of  [place] were acquired using MRA technique without intravenous contrast. COMPARISON:  Head CT yesterday. Brain MRI today reported separately. FINDINGS: Anterior circulation: Intermittent mild motion artifact. Antegrade flow in both ICA siphons to the carotid termini. Patent MCA and ACA origins. Mild to moderate bilateral supraclinoid ICA stenosis suspected. Anterior communicating artery and visible ACA branches are within normal limits, the right ACA A2 might be dominant. Left MCA M1 segment and bifurcation are  patent. Right MCA M1 segment is patent, but there is occlusion of the M2 middle sylvian branch at its origin at the bifurcation on series 10, image 106. The posterior M2 branch remains patent. Posterior circulation: Antegrade flow in the posterior circulation, the distal right vertebral artery appears dominant and the left appears to terminates in PICA. No visible distal vertebral stenosis. Patent basilar artery without stenosis. Patent basilar tip, SCA and PCA origins. Posterior communicating arteries are diminutive or absent. Motion artifact of the affects bilateral PCA branch detail, proximal PCAs are patent. Anatomic variants: Dominant right vertebral artery appears to supply the basilar. Right ACA A2 may be dominant. Other: MRI head at the same time is reported separately. IMPRESSION: 1. Positive for Right MCA M2 Occlusion: middle and/or anterior division branch origin is occluded at the MCA bifurcation. 2. 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 Study discussed with Dr. Tanda Rockers in the ED by telephone on 03/11/2022 at 10:44 . Electronically Signed   By: Odessa Fleming M.D.   On: 03/11/2022 10:53   DG Chest Port 1 View  Result Date: 03/11/2022 CLINICAL DATA:  Weakness EXAM: PORTABLE CHEST 1 VIEW COMPARISON:  None Available. FINDINGS: Lungs are clear.  No pleural effusion or pneumothorax. The  heart is normal in size. IMPRESSION: No evidence of acute cardiopulmonary disease. Electronically Signed   By: Charline Bills M.D.   On: 03/11/2022 00:11   CT CERVICAL SPINE WO CONTRAST  Result Date: 03/11/2022 CLINICAL DATA:  Unresponsive EXAM: CT CERVICAL SPINE WITHOUT CONTRAST TECHNIQUE: Multidetector CT imaging of the cervical spine was performed without intravenous contrast. Multiplanar CT image reconstructions were also generated. RADIATION DOSE REDUCTION: This exam was performed according to the departmental dose-optimization program which includes automated exposure control, adjustment of the mA and/or kV according to patient size and/or use of iterative reconstruction technique. COMPARISON:  None Available. FINDINGS: Alignment: Normal cervical lordosis. Skull base and vertebrae: No acute fracture. No primary bone lesion or focal pathologic process. Soft tissues and spinal canal: No prevertebral fluid or swelling. No visible canal hematoma. Disc levels: Mild degenerative changes of the mid/lower cervical spine. Spinal canal is patent. Upper chest: Visualized lung apices are clear. Other: Visualized thyroid is unremarkable. IMPRESSION: No traumatic injury of the cervical spine. Mild degenerative changes. Electronically Signed   By: Charline Bills M.D.   On: 03/11/2022 00:08   CT HEAD WO CONTRAST  Result Date: 03/11/2022 CLINICAL DATA:  Delirium. EXAM: CT HEAD WITHOUT CONTRAST TECHNIQUE: Contiguous axial images were obtained from the base of the skull through the vertex without intravenous contrast. RADIATION DOSE REDUCTION: This exam was performed according to the departmental dose-optimization program which includes automated exposure control, adjustment of the mA and/or kV according to patient size and/or use of iterative reconstruction technique. COMPARISON:  Head CT 03/18/2019 FINDINGS: Brain: No evidence of acute infarction, hemorrhage, hydrocephalus, extra-axial collection or mass  lesion/mass effect. Vascular: No hyperdense vessel or unexpected calcification. Skull: Normal. Negative for fracture or focal lesion. Sinuses/Orbits: Patient is status post sinonasal surgery on the left. Mastoid air cells are clear. Chronic nasal bone fractures are present. Orbits are within normal limits. Other: None. IMPRESSION: No acute intracranial abnormality. Electronically Signed   By: Darliss Cheney M.D.   On: 03/11/2022 00:07    Erick Blinks, MD  [facility]  If 7PM-7AM, please contact night-coverage [email or web address]  03/15/2022, 6:44 PM   LOS: 4 days

## 2022-03-15 NOTE — Progress Notes (Signed)
Physical Therapy Treatment Patient Details Name: Thomas Huber MRN: [id number] DOB: [birth year] Today's Date: 03/15/2022   History of Present Illness Thomas Huber is a 66 y.o. male with medical history significant of Parkinson disease who presents to the emergency department via EMS due to possible stroke.  Patient was alert and oriented x 3, but was unable to provide history of why he came to the ED.  History was obtained from ED physician and ED medical record.  Per report, he was found unresponsive in his apartment complex last known well was unknown.  EMS was activated and arrival of EMS team, blood glucose check was greater than 100, patient was reported to have facial droop slurred speech and left arm weakness.  Patient has no known history of seizures.    PT Comments    Patient requires assist to transition to seated EOB. He demonstrates good sitting balance and sitting tolerance at bedside. He is able to transfer to standing with assist, RW, and cueing. He demonstrates good standing tolerance while completing marches with UE support. He is able to take a few small steps at bedside with RW. Limited ambulation for safety as patient does not follow commands consistently. Frequent cueing provided for sequencing, hand placement, and LE mobility. Patient assisted back to bed at end of session. Patient will benefit from continued skilled physical therapy in hospital and recommended venue below to increase strength, balance, endurance for safe ADLs and gait.    Recommendations for follow up therapy are one component of a multi-disciplinary discharge planning process, led by the attending physician.  Recommendations may be updated based on patient status, additional functional criteria and insurance authorization.  Follow Up Recommendations  Acute inpatient rehab (3hours/day)     Assistance Recommended at Discharge Intermittent Supervision/Assistance  Patient can return home with the following A  lot of help with bathing/dressing/bathroom;A lot of help with walking and/or transfers;Help with stairs or ramp for entrance;Assistance with cooking/housework   Equipment Recommendations  None recommended by PT    Recommendations for Other Services       Precautions / Restrictions Precautions Precautions: Fall Restrictions Weight Bearing Restrictions: No     Mobility  Bed Mobility Overal bed mobility: Needs Assistance Bed Mobility: Supine to Sit, Sit to Supine     Supine to sit: Mod assist Sit to supine: Mod assist, Max assist   General bed mobility comments: increased time, labored movement    Transfers Overall transfer level: Needs assistance Equipment used: Rolling walker (2 wheels) Transfers: Sit to/from Stand Sit to Stand: Mod assist, Min assist           General transfer comment: Transfer to standing with RW, Multimodial cuing for direction following.    Ambulation/Gait Ambulation/Gait assistance: Mod assist Gait Distance (Feet): 2 Feet Assistive device: Rolling walker (2 wheels)         General Gait Details: several shuffled steps at bedside, extensive cueing required for proper RW use and LE mobility   Stairs             Wheelchair Mobility    Modified Rankin (Stroke Patients Only)       Balance Overall balance assessment: Needs assistance Sitting-balance support: Feet supported, No upper extremity supported Sitting balance-Leahy Scale: Fair Sitting balance - Comments: fair seated at EOB   Standing balance support: Bilateral upper extremity supported, During functional activity Standing balance-Leahy Scale: Fair Standing balance comment: fair/poor using RW  Cognition Arousal/Alertness: Awake/alert Behavior During Therapy: Impulsive Overall Cognitive Status: No family/caregiver present to determine baseline cognitive functioning                                 General Comments:  requires frequent repeated verbal/tactile cueing for following instrucitons        Exercises General Exercises - Upper Extremity Shoulder Flexion: AROM, Right, AAROM, Left, Other (comment), Standing (~x8 reps of A/ROM for R shoulder flexion with ~75% of available range achieved. L shoulder flexion required AA/ROM for ~4 reps with 50 to 75% of available rang achieved.) General Exercises - Lower Extremity Hip Flexion/Marching: AROM, Both, 10 reps, Standing Other Exercises Other Exercises: Pt able to march in place with RW and min to mod A while standing beside the bed. Compelted a couple set for 1 to 3 minutes at a time.    General Comments        Pertinent Vitals/Pain Pain Assessment Pain Assessment: Faces Faces Pain Scale: No hurt    Home Living                          Prior Function            PT Goals (current goals can now be found in the care plan section) Acute Rehab PT Goals Patient Stated Goal: return home PT Goal Formulation: With patient Time For Goal Achievement: 04/15/2022 Potential to Achieve Goals: Good Progress towards PT goals: Progressing toward goals    Frequency    Min 4X/week      PT Plan Current plan remains appropriate    Co-evaluation PT/OT/SLP Co-Evaluation/Treatment: Yes Reason for Co-Treatment: Complexity of the patient's impairments (multi-system involvement);To address functional/ADL transfers PT goals addressed during session: Mobility/safety with mobility;Balance;Proper use of DME;Strengthening/ROM OT goals addressed during session: ADL's and self-care      AM-PAC PT "6 Clicks" Mobility   Outcome Measure  Help needed turning from your back to your side while in a flat bed without using bedrails?: None Help needed moving from lying on your back to sitting on the side of a flat bed without using bedrails?: A Little Help needed moving to and from a bed to a chair (including a wheelchair)?: A Lot Help needed standing up  from a chair using your arms (e.g., wheelchair or bedside chair)?: A Lot Help needed to walk in hospital room?: A Lot Help needed climbing 3-5 steps with a railing? : A Lot 6 Click Score: 15    End of Session Equipment Utilized During Treatment: Gait belt Activity Tolerance: Patient tolerated treatment well;Patient limited by fatigue Patient left: in bed;with call bell/phone within reach;with bed alarm set Nurse Communication: Mobility status PT Visit Diagnosis: Unsteadiness on feet (R26.81);Other abnormalities of gait and mobility (R26.89);Muscle weakness (generalized) (M62.81)     Time: 4481-8563 PT Time Calculation (min) (ACUTE ONLY): 25 min  Charges:  $Therapeutic Activity: 8-22 mins                     12:20 PM, 03/15/22 Wyman Songster PT, DPT Physical Therapist at [facility]

## 2022-03-15 NOTE — Progress Notes (Signed)
   03/15/22 1439  Assess: MEWS Score  Temp 99.2 F (37.3 C)  BP 131/67  MAP (mmHg) 86  Pulse Rate 92  Resp 20  SpO2 98 %  Assess: MEWS Score  MEWS Temp 0  MEWS Systolic 0  MEWS Pulse 0  MEWS RR 0  MEWS LOC 1  MEWS Score 1  MEWS Score Color Green  Assess: SIRS CRITERIA  SIRS Temperature  0  SIRS Pulse 1  SIRS Respirations  0  SIRS WBC 0  SIRS Score Sum  1

## 2022-03-15 NOTE — Progress Notes (Signed)
Patient was a RED mews at the start of shift. Orders were received. His temperature has slowly come down however, his respirations remain elevated

## 2022-03-15 NOTE — Progress Notes (Signed)
   03/15/22 1000  Assess: MEWS Score  Temp 99.5 F (37.5 C)  BP 123/67  MAP (mmHg) 85  Pulse Rate (!) 105  Resp 20  Level of Consciousness Responds to Voice  SpO2 92 %  O2 Device Room Air  Assess: MEWS Score  MEWS Temp 0  MEWS Systolic 0  MEWS Pulse 1  MEWS RR 0  MEWS LOC 1  MEWS Score 2  MEWS Score Color Yellow  Assess: if the MEWS score is Yellow or Red  Were vital signs taken at a resting state? Yes  Focused Assessment No change from prior assessment  Does the patient meet 2 or more of the SIRS criteria? No  Does the patient have a confirmed or suspected source of infection? No  MEWS guidelines implemented *See Row Information* Yes  Treat  Pain Scale 0-10  Pain Score 0  Take Vital Signs  Increase Vital Sign Frequency  Yellow: Q 2hr X 2 then Q 4hr X 2, if remains yellow, continue Q 4hrs  Escalate  MEWS: Escalate Yellow: discuss with charge nurse/RN and consider discussing with provider and RRT  Notify: Charge Nurse/RN  Name of Charge Nurse/RN Notified Brandie RN  Date Charge Nurse/RN Notified 03/15/22  Time Charge Nurse/RN Notified 1012  Provider Notification  Provider Name/Title MD Memon  Date Provider Notified 03/15/22  Time Provider Notified 1013  Method of Notification Page (Secure chat)  Notification Reason Other (Comment) (Yellow MEWS)  Provider response Other (Comment) (Continue to monitor patient)  Date of Provider Response 03/15/22  Time of Provider Response 1013  Assess: SIRS CRITERIA  SIRS Temperature  0  SIRS Pulse 1  SIRS Respirations  0  SIRS WBC 0  SIRS Score Sum  1

## 2022-03-15 NOTE — Progress Notes (Signed)
     Referral received for Thomas Huber for goals of care discussion. Chart reviewed and updates received from RN. Patient assessed and is unable to engage appropriately in discussions.   I was able to speak with patient's only involved/reachable family Thomas Huber. GOC meeting scheduled for 03/15/22 @ 2:00 pm. Family is aware we will meet at patient's bedside. Had a breif explanation of her cousin's medical situation and need for meeting to discuss goals.  Thank you for your referral and allowing PMT to assist in Thomas Huber's care.   Wynne Dust, NP Palliative Medicine Team Phone: [phone]  NO CHARGE

## 2022-03-15 NOTE — Progress Notes (Signed)
   03/14/22 2043  Assess: MEWS Score  BP 125/72  MAP (mmHg) 87  Pulse Rate (!) 108  Resp 19  SpO2 92 %  O2 Device Room Air  Assess: MEWS Score  MEWS Temp 2  MEWS Systolic 0  MEWS Pulse 1  MEWS RR 0  MEWS LOC 1  MEWS Score 4  MEWS Score Color Red  Assess: if the MEWS score is Yellow or Red  Were vital signs taken at a resting state? Yes  Focused Assessment No change from prior assessment  Does the patient meet 2 or more of the SIRS criteria? Yes  Take Vital Signs  Increase Vital Sign Frequency  Red: Q 1hr X 4 then Q 4hr X 4, if remains red, continue Q 4hrs  Escalate  MEWS: Escalate Red: discuss with charge nurse/RN and provider, consider discussing with RRT  Notify: Charge Nurse/RN  Name of Charge Nurse/RN Notified Edgar Frisk, RN  Date Charge Nurse/RN Notified 03/14/22  Time Charge Nurse/RN Notified 2102  Provider Notification  Provider Name/Title Dr. Carren Rang  Date Provider Notified 03/14/22  Time Provider Notified 2045  Provider response No new orders (MD already wrote orders for recent MEWS)  Assess: SIRS CRITERIA  SIRS Temperature  1  SIRS Pulse 1  SIRS Respirations  0  SIRS WBC 0  SIRS Score Sum  2

## 2022-03-15 NOTE — Ethics Note (Signed)
Ethics Consult Note  Initial contact w/ medical team 03/14/22, which was on patient's hospital day 3  Source of Consult: Dr Kerry Hough Current attending physician/service: Erick Blinks, MD  Reason(s) for consult / relevant ethical question(s): Code status and consent for other procedures/treatment in patient lacking full capacity and no next of kin / legal surrogate able to be located    Information-gathering: Discussion with source of consult  Narrative:  Medical situation: Patient has suffered stroke with significant residual deficits complicated by underlying history of Parkinsons which severely limits his ability to understand and communicate his wishes regarding medical treatment. This patient lives alone, no contact information for [facility] or anyone else who might speak on his behalf. Hospitalist attending inquires about appropriate decision-making particularly with regard to code status and with regard to life-prolonging measures such as feeding tube. Patient currently unable to swallow, he has NG tube in place for medication administration, medical team is considering placement of PEG tube. DSS has been contacted and temporary guardianship is in process.       Recommendations / Ethical Analysis:  Without clear evidence of patient's wishes, typically the default is "presumed consent" to administer treatment including life-prolonging measures unless a patient has an imminently terminal condition which would render those measures medically futile.  If a patient cannot provide informed consent, then  See: [facility] Statutes Chapter 90. Medicine and Allied Occupations  90-21.13. Informed consent to health care treatment or procedure. In brief summary, this statute outlines that the following persons, in order indicated, are authorized to consent to medical treatment on behalf of the patient who does not demonstrate capacity to do so: Legally assigned guardian appointed by the  court > healthcare agent appointed by legal healthcare power of attorney > healthcare agent appointed by the patient > legal spouse > majority of the patient's reasonably available parents and children who are at least 68 years of age > individual who has an established relationship with the patient, who is acting in good faith on behalf of the patient, and who can reliably convey the patient's wishes > attending physician with confirmation by another physician of the patient's condition and the necessity for treatment (unless in urgent/emergent situation)  In this case, recommendation would be to assume patient would want to pursue life-prolonging measures such as feeding tube, long term care, etc. They may even wish to be FULL CODE, and it would be ethically appropriate to maintain this designation, unless the medical team 1) receives reliable information to suggest the patient would NOT want extraordinary measures taken such as resuscitation, 2) is able to discuss with an appropriate surrogate decision maker who can consent or refuse treatment on the patient's behalf including CPR/intubation, 3) is of the opinion that cardiopulmonary arrest and subsequent resuscitative measures would not result in survival / would cause substantial harm without benefit - especially given the patient's underlying medical comorbidities such as Parkinson's and how advanced this illness is in his case.  In cases of medical futility, it is appropriate for the attending physician to confirm with at least one other consulting physician in a specialty relevant to the patient's conditions and to reach consensus on risk/benefit/need of any given procedure, see above re: [place] statute       Relevant underlying ethical principles were discussed directly with Dr. Kerry Hough (attending physician) on 03/14/22. Other resources were discussed directly with them, if needed. Ethics committee strives to ensure that all necessary and appropriate  steps are taken by the medical  team such that all decisions made for this patient are ethically appropriate. Please note that Ethics committee members will NOT offer legal or medical counsel.      Thank you for this consult. Ethics will continue to follow this case.   Dr Kerry Hough has my personal cell phone number and has my permission to share this number at their discretion.  Secure message on Epic is also welcome but may not receive an immediate response.  Please reference AMION for on-call committee member if needed.    Sunnie Nielsen [facility] Ethics Committee

## 2022-03-15 NOTE — Progress Notes (Signed)
   03/15/22 0000  Assess: MEWS Score  Temp 99.9 F (37.7 C)  BP (!) 104/57  MAP (mmHg) 72  Pulse Rate 88  Resp (!) 32  SpO2 92 %  O2 Device Room Air  Assess: MEWS Score  MEWS Temp 0  MEWS Systolic 0  MEWS Pulse 0  MEWS RR 2  MEWS LOC 1  MEWS Score 3  MEWS Score Color Yellow  Assess: if the MEWS score is Yellow or Red  Were vital signs taken at a resting state? Yes  Focused Assessment No change from prior assessment  Does the patient meet 2 or more of the SIRS criteria? No  Does the patient have a confirmed or suspected source of infection? No  MEWS guidelines implemented *See Row Information* Yes  Treat  Pain Scale 0-10  Pain Score Asleep  Notify: Charge Nurse/RN  Name of Charge Nurse/RN Notified Edgar Frisk, RN  Date Charge Nurse/RN Notified 03/15/22  Time Charge Nurse/RN Notified 0030  Assess: SIRS CRITERIA  SIRS Temperature  0  SIRS Pulse 0  SIRS Respirations  1  SIRS WBC 0  SIRS Score Sum  1

## 2022-03-15 NOTE — Progress Notes (Signed)
Occupational Therapy Treatment Patient Details Name: Thomas Huber MRN: [id number] DOB: 12-29-1955 Today's Date: 03/15/2022   History of present illness Thomas Huber is a 66 y.o. male with medical history significant of Parkinson disease who presents to the emergency department via EMS due to possible stroke.  Patient was alert and oriented x 3, but was unable to provide history of why he came to the ED.  History was obtained from ED physician and ED medical record.  Per report, he was found unresponsive in his apartment complex last known well was unknown.  EMS was activated and arrival of EMS team, blood glucose check was greater than 100, patient was reported to have facial droop slurred speech and left arm weakness.  Patient has no known history of seizures.   OT comments  Pt agreeable to OT and PT co-treatment today. Pt still struggling with verbal communication. Pt mostly mumbling but able to following directions if given verbal and tactile cuing. Assist needed for bed mobility to pull to sit at EOB and to bring B LE into bed. PT able to stand and march in place with mod A. Pt able to take a few steps forward but needed additional assist to move B LE back towards the bed. Possible clonus noted in R UE today. Pt able to actively lift R and L UE for flexion exercises while standing. Assist needed for L UE flexion. Pt is progressing and was able to stand at bedside for several minutes with the RW today. Pt left in bed with bed alarm set. Pt will benefit from continued OT in the hospital and recommended venue below to increase strength, balance, and endurance for safe ADL's.      Recommendations for follow up therapy are one component of a multi-disciplinary discharge planning process, led by the attending physician.  Recommendations may be updated based on patient status, additional functional criteria and insurance authorization.    Follow Up Recommendations  Acute inpatient rehab (3hours/day)      Assistance Recommended at Discharge Frequent or constant Supervision/Assistance  Patient can return home with the following  A lot of help with walking and/or transfers;A lot of help with bathing/dressing/bathroom;Assistance with cooking/housework;Direct supervision/assist for medications management;Assist for transportation;Help with stairs or ramp for entrance;Assistance with feeding   Equipment Recommendations  None recommended by OT    Recommendations for Other Services Rehab consult    Precautions / Restrictions Precautions Precautions: Fall Restrictions Weight Bearing Restrictions: No       Mobility Bed Mobility Overal bed mobility: Needs Assistance Bed Mobility: Supine to Sit, Sit to Supine     Supine to sit: Mod assist Sit to supine: Mod assist, Max assist   General bed mobility comments: increased time, labored movement    Transfers Overall transfer level: Needs assistance Equipment used: Rolling walker (2 wheels) Transfers: Sit to/from Stand Sit to Stand: Mod assist, Min assist           General transfer comment: Pt remained at bedside other than some ambulation away from bed for a few steps. Much verbal and tactile cuing for direction following.     Balance Overall balance assessment: Needs assistance Sitting-balance support: Feet supported, No upper extremity supported Sitting balance-Leahy Scale: Fair Sitting balance - Comments: fair seated at EOB   Standing balance support: Bilateral upper extremity supported, During functional activity Standing balance-Leahy Scale: Fair Standing balance comment: fair/poor using RW  ADL either performed or assessed with clinical judgement   ADL Overall ADL's : Needs assistance/impaired                     Lower Body Dressing: Sitting/lateral leans;Maximal assistance Lower Body Dressing Details (indicate cue type and reason): Attempted with assist to position  pt's L LE over his R knee but pt was unable to attempt. Direction following possibly impacting.             Functional mobility during ADLs: Moderate assistance;Rolling walker (2 wheels) General ADL Comments: Pt able to take a few steps away from bed needing mod A back towards bed with tactile cuing and assist to move B LE backwards.    Extremity/Trunk Assessment Upper Extremity Assessment Upper Extremity Assessment:  (Some possible clonus noted in R wrist. Still difficult to fully assess B UE due to direction following deficits.)                              Cognition Arousal/Alertness: Awake/alert Behavior During Therapy: Impulsive Overall Cognitive Status: No family/caregiver present to determine baseline cognitive functioning                                 General Comments: requires frequent repeated verbal/tactile cueing for following instrucitons        Exercises Exercises: General Upper Extremity, Other exercises General Exercises - Upper Extremity Shoulder Flexion: AROM, Right, AAROM, Left, Other (comment), Standing (~x8 reps of A/ROM for R shoulder flexion with ~75% of available range achieved. L shoulder flexion required AA/ROM for ~4 reps with 50 to 75% of available rang achieved.) Other Exercises Other Exercises: Pt able to march in place with RW and min to mod A while standing beside the bed. Compelted a couple set for 1 to 3 minutes at a time.    Shoulder Instructions       General Comments      Pertinent Vitals/ Pain       Pain Assessment Pain Assessment: Faces Faces Pain Scale: No hurt                                                          Frequency  Min 2X/week        Progress Toward Goals  OT Goals(current goals can now be found in the care plan section)  Progress towards OT goals: Progressing toward goals  Acute Rehab OT Goals Patient Stated Goal: go home OT Goal Formulation: With  patient Time For Goal Achievement: 2022/04/04 Potential to Achieve Goals: Fair ADL Goals Pt Will Perform Grooming: with min guard assist;standing Pt Will Perform Upper Body Bathing: with modified independence;sitting Pt Will Perform Lower Body Bathing: with min assist;sitting/lateral leans Pt Will Perform Upper Body Dressing: with modified independence;sitting Pt Will Perform Lower Body Dressing: with min assist;sitting/lateral leans Pt Will Transfer to Toilet: with min guard assist;with supervision;ambulating Pt Will Perform Toileting - Clothing Manipulation and hygiene: with supervision;sitting/lateral leans Pt/caregiver will Perform Home Exercise Program: Increased ROM;Increased strength;Both right and left upper extremity;With Supervision  Plan Discharge plan remains appropriate    Co-evaluation    PT/OT/SLP Co-Evaluation/Treatment: Yes Reason for Co-Treatment: Complexity of the patient's impairments (  multi-system involvement)   OT goals addressed during session: ADL's and self-care                        End of Session Equipment Utilized During Treatment: Rolling walker (2 wheels);Gait belt  OT Visit Diagnosis: Unsteadiness on feet (R26.81);Other abnormalities of gait and mobility (R26.89);Muscle weakness (generalized) (M62.81);Other symptoms and signs involving cognitive function;Cognitive communication deficit (R41.841) Symptoms and signs involving cognitive functions: Cerebral infarction   Activity Tolerance Patient tolerated treatment well   Patient Left in bed;with call bell/phone within reach;with bed alarm set   Nurse Communication          Time: 9470-9628 OT Time Calculation (min): 27 min  Charges: OT General Charges $OT Visit: 1 Visit OT Treatments $Therapeutic Exercise: 8-22 mins  [place] Hair OT, MOT  [name] 03/15/2022, 11:57 AM

## 2022-03-15 NOTE — TOC Progression Note (Signed)
Transition of Care [facility]) - Progression Note    Patient Details  Name: Thomas Huber MRN: [id number] Date of Birth: 04-08-1955  Transition of Care [facility]) CM/SW Contact  Elliot Gault, LCSW Phone Number: 03/15/2022, 10:41 AM  Clinical Narrative:     Per RN, pt's niece has made contact with staff and her updated phone number was provided. TOC updated DSS. At this time, DSS has to remain interim guardian until the court hearing on 03/28/22 at which time, niece can be appointed temporary guardian if she is interested.   Updated MD and Palliative APNP. Niece coming in to meet with Palliative this afternoon. Per DSS, her input should be considered when determining best course of care.  TOC will follow.  Expected Discharge Plan: IP Rehab Facility Barriers to Discharge: Continued Medical Work up  Expected Discharge Plan and Services In-house Referral: Clinical Social Work     Living arrangements for the past 2 months: Apartment                                       Social Determinants of Health (SDOH) Interventions SDOH Screenings   Food Insecurity: No Food Insecurity (03/11/2022)  Housing: Low Risk  (03/11/2022)  Transportation Needs: No Transportation Needs (03/11/2022)  Utilities: Not At Risk (03/11/2022)  Tobacco Use: Low Risk  (2022/04/01)    Readmission Risk Interventions     No data to display

## 2022-03-15 NOTE — Progress Notes (Signed)
Patient seen by our palliative care NP and I discussed this case in detail. This patient has an immanently terminal condition due to large stroke and has very little chance of making a significant or meaningful recovery. To do CPR on this patient as the default position at the time of death would inflict pain and suffering without and medical benefit. The DNR order was intended for people who desired to have a natural death not a tool or default position to inflict potentially inappropriate medical interventions. Noted conflicting issues with temporary guardian and making this decision, but the medical recommendation is for DNR and I strongly advise against an aggressive resuscitation effort at the end of this patients life. The inpatient DNR is a medical order based on clinical data and a patient's goals of care. Not a directive or legal decision. I will place a DNR for this patient to protect him from aggressive interventions when he becomes pulseless and stops breathing-outside of that scenario, all reasonable supportive and medical interventions can and will  be provided.   Anderson Malta, DO Palliative Medicine

## 2022-03-16 DIAGNOSIS — J690 Pneumonitis due to inhalation of food and vomit: Secondary | ICD-10-CM

## 2022-03-16 DIAGNOSIS — Z7189 Other specified counseling: Secondary | ICD-10-CM

## 2022-03-16 DIAGNOSIS — R1310 Dysphagia, unspecified: Secondary | ICD-10-CM

## 2022-03-16 DIAGNOSIS — L8990 Pressure ulcer of unspecified site, unspecified stage: Secondary | ICD-10-CM | POA: Insufficient documentation

## 2022-03-16 DIAGNOSIS — I63511 Cerebral infarction due to unspecified occlusion or stenosis of right middle cerebral artery: Secondary | ICD-10-CM | POA: Diagnosis not present

## 2022-03-16 DIAGNOSIS — G9341 Metabolic encephalopathy: Secondary | ICD-10-CM | POA: Insufficient documentation

## 2022-03-16 DIAGNOSIS — E785 Hyperlipidemia, unspecified: Secondary | ICD-10-CM | POA: Insufficient documentation

## 2022-03-16 LAB — CBC
HCT: 36.5 % — ABNORMAL LOW (ref 39.0–52.0)
Hemoglobin: 12.5 g/dL — ABNORMAL LOW (ref 13.0–17.0)
MCH: 30.9 pg (ref 26.0–34.0)
MCHC: 34.2 g/dL (ref 30.0–36.0)
MCV: 90.1 fL (ref 80.0–100.0)
Platelets: 105 10*3/uL — ABNORMAL LOW (ref 150–400)
RBC: 4.05 MIL/uL — ABNORMAL LOW (ref 4.22–5.81)
RDW: 12.4 % (ref 11.5–15.5)
WBC: 6.1 10*3/uL (ref 4.0–10.5)
nRBC: 0 % (ref 0.0–0.2)

## 2022-03-16 LAB — BASIC METABOLIC PANEL
Anion gap: 5 (ref 5–15)
BUN: 14 mg/dL (ref 8–23)
CO2: 26 mmol/L (ref 22–32)
Calcium: 7.9 mg/dL — ABNORMAL LOW (ref 8.9–10.3)
Chloride: 111 mmol/L (ref 98–111)
Creatinine, Ser: 0.85 mg/dL (ref 0.61–1.24)
GFR, Estimated: >60 mL/min (ref >60)
Glucose, Bld: 99 mg/dL (ref 70–99)
Potassium: 2.7 mmol/L — CL (ref 3.5–5.1)
Sodium: 142 mmol/L (ref 135–145)

## 2022-03-16 LAB — MAGNESIUM: Magnesium: 2.2 mg/dL (ref 1.7–2.4)

## 2022-03-16 MED ORDER — POTASSIUM CHLORIDE 20 MEQ PO PACK
40.0000 meq | PACK | ORAL | Status: AC
  Administered 2022-03-16 (×3): 40 meq
  Filled 2022-03-16 (×3): qty 2

## 2022-03-16 NOTE — Progress Notes (Signed)
Progress Note    Thomas Huber   WUJ:[id number]  DOB: 1955-08-24  DOA: 03/22/2022     5 PCP: Thomas Specking, MD  Initial CC: found down  Hospital Course: Thomas Huber is a 66 year old male with a history of Parkinson's disease who presented when he was found unresponsive in his apartment.  The last known well time was unclear.  History was unobtainable because of the patient's encephalopathy. Apparently, EMS noted the patient to have facial droop, slurred speech, left arm weakness.  On 03/11/2022 after arrival, the patient eventually became agitated and required sedation with IV Ativan.  An NG tube was subsequently placed because of his dysphagia.  Neurology was consulted.  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 Notably, the patient did have a long history of alcohol dependence previously, but quit slightly over 1 year prior to this admission. Due to unavailable family initially there was a DSS request for interm guardianship. After this, the patient's cousin was able to be reached and became involved in patient's care/decision making.   Interval History:  No events overnight.  Resting in bed when seen appearing chronically ill but at least comfortable.  Patient could not partake in much discussion.  Was able to answer yes or no to some questions and nod head.  Assessment and Plan:  Acute CVA -Appreciate Neurology Consult - dispo TBD; SNF vs CIR vs possibly even residential hospice  -CT brain--no acute abnormality -MRI brain--acute right MCA infarct with cytotoxic edema -MRA brain--right MCA M2 occlusion -Carotid Duplex--no hemodynamically significant stenosis -Echo--EF 60-65%, no WMA, grade 1 DD, no PFO -Will need 14-day event monitor after discharge -LDL--85 -HbA1C--5.2 -Antiplatelet--ASA 81 + Plavix 75 mg x 3 weeks, then ASA 81 alone - poor PEG candidate and still dependent on NGT for TF  Goals of  care -Currently has NG tube for meds -He has significant dysphagia and is currently unable to swallow.  Suspect that he also may be aspirating his oral secretions -Initially, no family available to address further goals/decision making and therefore DSS started interim guardianship - After DSS involvement, patient's cousin called to check on him and is now engaged in his care.  His cousin Thomas Huber reports that her and her husband were very involved in patient's care prior to his admission.  They would take him to his doctors appointments. In fact he had stayed with them for approximately a month after his mother's death. They relate that Thomas Huber truly liked his independence which is why he was still living independently, with Thomas Huber and her husband frequently checking on him.  Kim feels certain that Thomas Huber would not want any aggressive measures such as intubation or CPR at the end of his life.  She is strongly in support of DNR. -Thomas Huber reviewed the case with palliative care Thomas Huber, who's team has also been following.  Please refer to her note.  Considering patient's current mental status, neurologic deficits, high risk for complications such as recurrent aspiration pneumonia and underlying Parkinson's disease, it is agreed that CPR and intubation at the time of his death would inflict pain and suffering and unlikely provide any meaningful medical benefit.   - I agree with Thomas Huber opinion that DNR would be appropriate in this situation.  His family also agrees with this recommendation and feel as though the patient would want the same -Patient will likely need to be monitored in the hospital to  see if he has any further neurologic improvement and if he is able to advance to PO diet; if not I would not support PEG placement as this would only prolong further suffering in context of a futile situation and not add any meaningful benefit as noted above; if this were to be the case, then patient would be more  appropriate for further consideration of a hospice/comfort care route    Acute metabolic encephalopathy -Secondary to ischemic stroke with cytotoxic edema and B12 deficiency -UA negative for pyuria -B12 --109>> supplementing -Folic acid 8.5 -Ammonia 24 -TSH 1.3   Dyslipidemia -Continue statin -LDL 85   Parkinson's disease -Currently on Sinemet through NG tube   Alcohol dependence in remission -Start thiamine   Dysphagia -NG inserted -Patient noted to be very rhonchorous/gurgling despite not getting any significant tube feeding.  Suspect he is aspirating his oral secretions -Speech to be following, but they have not been able to perform an evaluation to assess his swallowing as of yet.  Primary limitation with his mental status   Fever - Likely aspirating; speaks to guarded/poor prognosis from CVA - some fever appreciated - continue unasyn  -Continue frequent suctioning  Old records reviewed in assessment of this patient  Antimicrobials: Unasyn 12/21 >> current   DVT prophylaxis:  SCDs Start: 03/11/22 0447   Code Status:   Code Status: DNR  Mobility Assessment (last 72 hours)     Mobility Assessment     Row Name 03/15/22 1215 03/15/22 1100 03/15/22 0928 03/14/22 2036 03/14/22 0800   Does patient have an order for bedrest or is patient medically unstable -- -- Yes- Bedfast (Level 1) - Complete No - Continue assessment No - Continue assessment   What is the highest level of mobility based on the progressive mobility assessment? Level 4 (Walks with assist in room) - Balance while marching in place and cannot step forward and back - Complete Level 4 (Walks with assist in room) - Balance while marching in place and cannot step forward and back - Complete -- Level 1 (Bedfast) - Unable to balance while sitting on edge of bed Level 3 (Stands with assist) - Balance while standing  and cannot march in place   Is the above level different from baseline mobility prior to current  illness? -- -- -- Yes - Recommend PT order Yes - Recommend PT order            Barriers to discharge:  Disposition Plan:  pending clinical course; guarded prognosis Status is: Inpt  Objective: Blood pressure (!) 146/71, pulse 80, temperature 98.9 F (37.2 C), temperature source Oral, resp. rate 19, height 5\' 7"  (1.702 m), weight 86.2 kg, SpO2 97 %.  Examination:  Physical Exam Constitutional:      Comments: Chronically ill-appearing adult man lying in bed with active gurgling sounds and congestion  HENT:     Head: Normocephalic and atraumatic.     Nose:     Comments: NGT in place    Mouth/Throat:     Mouth: Mucous membranes are moist.  Eyes:     Extraocular Movements: Extraocular movements intact.  Cardiovascular:     Rate and Rhythm: Normal rate and regular rhythm.  Pulmonary:     Breath sounds: Rhonchi and rales present. No wheezing.     Comments: Diffuse coarse breath sounds bilaterally Abdominal:     General: Bowel sounds are normal. There is no distension.     Palpations: Abdomen is soft.     Tenderness:  There is no abdominal tenderness.  Musculoskeletal:        General: Swelling present.     Cervical back: Normal range of motion and neck supple.  Skin:    General: Skin is warm and dry.  Neurological:     Comments: Unable to speak much, only one-word answers.  Follows some commands.  Moves all 4 extremities but appreciated weakness involving left upper and lower extremity, approximately 4/5      Consultants:  Palliative care Ethics Neurology  Procedures:    Data Reviewed: Results for orders placed or performed during the hospital encounter of 03-17-2022 (from the past 24 hour(s))  CBC     Status: Abnormal   Collection Time: 03/16/22  6:05 AM  Result Value Ref Range   WBC 6.1 4.0 - 10.5 K/uL   RBC 4.05 (L) 4.22 - 5.81 MIL/uL   Hemoglobin 12.5 (L) 13.0 - 17.0 g/dL   HCT 65.6 (L) 81.2 - 75.1 %   MCV 90.1 80.0 - 100.0 fL   MCH 30.9 26.0 - 34.0 pg   MCHC  34.2 30.0 - 36.0 g/dL   RDW 70.0 17.4 - 94.4 %   Platelets 105 (L) 150 - 400 K/uL   nRBC 0.0 0.0 - 0.2 %  Basic metabolic panel     Status: Abnormal   Collection Time: 03/16/22  6:05 AM  Result Value Ref Range   Sodium 142 135 - 145 mmol/L   Potassium 2.7 (LL) 3.5 - 5.1 mmol/L   Chloride 111 98 - 111 mmol/L   CO2 26 22 - 32 mmol/L   Glucose, Bld 99 70 - 99 mg/dL   BUN 14 8 - 23 mg/dL   Creatinine, Ser 9.67 0.61 - 1.24 mg/dL   Calcium 7.9 (L) 8.9 - 10.3 mg/dL   GFR, Estimated >59 >16 mL/min   Anion gap 5 5 - 15  Magnesium     Status: None   Collection Time: 03/16/22  8:14 AM  Result Value Ref Range   Magnesium 2.2 1.7 - 2.4 mg/dL    I have Reviewed nursing notes, Vitals, and Lab results since pt's last encounter. Pertinent lab results : see above I have ordered labwork to follow up on.  I have reviewed the last note from staff over past 24 hours I have discussed pt's care plan and test results with nursing staff, CM/SW, and other staff as appropriate  Time spent: Greater than 50% of the 55 minute visit was spent in counseling/coordination of care for the patient as laid out in the A&P.   LOS: 5 days   Lewie Chamber, MD [facility] 03/16/2022, 2:17 PM

## 2022-03-16 NOTE — Progress Notes (Signed)
Critical potassium of 2.7 this am. Dr Philomena Course with critical lab work. No new orders at this time.

## 2022-03-17 DIAGNOSIS — I63511 Cerebral infarction due to unspecified occlusion or stenosis of right middle cerebral artery: Secondary | ICD-10-CM | POA: Diagnosis not present

## 2022-03-17 DIAGNOSIS — J690 Pneumonitis due to inhalation of food and vomit: Secondary | ICD-10-CM

## 2022-03-17 DIAGNOSIS — Z7189 Other specified counseling: Secondary | ICD-10-CM | POA: Diagnosis not present

## 2022-03-17 DIAGNOSIS — R1310 Dysphagia, unspecified: Secondary | ICD-10-CM

## 2022-03-17 LAB — CBC WITH DIFFERENTIAL/PLATELET
Abs Immature Granulocytes: 0.02 10*3/uL (ref 0.00–0.07)
Basophils Absolute: 0 10*3/uL (ref 0.0–0.1)
Basophils Relative: 1 %
Eosinophils Absolute: 0.1 10*3/uL (ref 0.0–0.5)
Eosinophils Relative: 2 %
HCT: 36.3 % — ABNORMAL LOW (ref 39.0–52.0)
Hemoglobin: 12.2 g/dL — ABNORMAL LOW (ref 13.0–17.0)
Immature Granulocytes: 0 %
Lymphocytes Relative: 18 %
Lymphs Abs: 1 10*3/uL (ref 0.7–4.0)
MCH: 30.4 pg (ref 26.0–34.0)
MCHC: 33.6 g/dL (ref 30.0–36.0)
MCV: 90.5 fL (ref 80.0–100.0)
Monocytes Absolute: 0.4 10*3/uL (ref 0.1–1.0)
Monocytes Relative: 7 %
Neutro Abs: 4.3 10*3/uL (ref 1.7–7.7)
Neutrophils Relative %: 72 %
Platelets: 136 10*3/uL — ABNORMAL LOW (ref 150–400)
RBC: 4.01 MIL/uL — ABNORMAL LOW (ref 4.22–5.81)
RDW: 12.6 % (ref 11.5–15.5)
WBC: 5.9 10*3/uL (ref 4.0–10.5)
nRBC: 0 % (ref 0.0–0.2)

## 2022-03-17 LAB — CULTURE, BLOOD (ROUTINE X 2)
Culture: NO GROWTH
Culture: NO GROWTH
Special Requests: ADEQUATE
Special Requests: ADEQUATE

## 2022-03-17 LAB — BASIC METABOLIC PANEL
Anion gap: 6 (ref 5–15)
BUN: 12 mg/dL (ref 8–23)
CO2: 24 mmol/L (ref 22–32)
Calcium: 8.1 mg/dL — ABNORMAL LOW (ref 8.9–10.3)
Chloride: 113 mmol/L — ABNORMAL HIGH (ref 98–111)
Creatinine, Ser: 0.7 mg/dL (ref 0.61–1.24)
GFR, Estimated: >60 mL/min (ref >60)
Glucose, Bld: 96 mg/dL (ref 70–99)
Potassium: 3.6 mmol/L (ref 3.5–5.1)
Sodium: 143 mmol/L (ref 135–145)

## 2022-03-17 LAB — MAGNESIUM: Magnesium: 2.1 mg/dL (ref 1.7–2.4)

## 2022-03-17 MED ORDER — GLYCOPYRROLATE 0.2 MG/ML IJ SOLN: 0.2000 mg | INTRAMUSCULAR | Status: DC | PRN

## 2022-03-17 MED ORDER — HALOPERIDOL 0.5 MG PO TABS: 0.5000 mg | ORAL_TABLET | ORAL | Status: DC | PRN

## 2022-03-17 MED ORDER — MORPHINE SULFATE (PF) 2 MG/ML IV SOLN: 2.0000 mg | INTRAVENOUS | Status: DC | PRN

## 2022-03-17 MED ORDER — LORAZEPAM 2 MG/ML IJ SOLN
1.0000 mg | INTRAMUSCULAR | Status: DC | PRN
  Administered 2022-03-18 – 2022-03-23 (×4): 1 mg via INTRAVENOUS
  Filled 2022-03-17 (×4): qty 1

## 2022-03-17 MED ORDER — GLYCOPYRROLATE 0.2 MG/ML IJ SOLN
0.2000 mg | INTRAMUSCULAR | Status: DC | PRN
  Administered 2022-03-18: 0.2 mg via INTRAVENOUS
  Filled 2022-03-17: qty 1

## 2022-03-17 MED ORDER — ONDANSETRON HCL 4 MG/2ML IJ SOLN: 4.0000 mg | Freq: Four times a day (QID) | INTRAMUSCULAR | Status: DC | PRN

## 2022-03-17 MED ORDER — POLYVINYL ALCOHOL 1.4 % OP SOLN: 1.0000 [drp] | Freq: Four times a day (QID) | OPHTHALMIC | Status: DC | PRN

## 2022-03-17 MED ORDER — HALOPERIDOL LACTATE 2 MG/ML PO CONC: 0.5000 mg | ORAL | Status: DC | PRN

## 2022-03-17 MED ORDER — ONDANSETRON 4 MG PO TBDP: 4.0000 mg | ORAL_TABLET | Freq: Four times a day (QID) | ORAL | Status: DC | PRN

## 2022-03-17 MED ORDER — ACETAMINOPHEN 325 MG PO TABS: 650.0000 mg | ORAL_TABLET | Freq: Four times a day (QID) | ORAL | Status: DC | PRN

## 2022-03-17 MED ORDER — GLYCOPYRROLATE 1 MG PO TABS: 1.0000 mg | ORAL_TABLET | ORAL | Status: DC | PRN

## 2022-03-17 MED ORDER — LORAZEPAM 2 MG/ML PO CONC: 1.0000 mg | ORAL | Status: DC | PRN

## 2022-03-17 MED ORDER — ACETAMINOPHEN 650 MG RE SUPP
650.0000 mg | Freq: Four times a day (QID) | RECTAL | Status: DC | PRN
  Administered 2022-03-24: 650 mg via RECTAL
  Filled 2022-03-17 (×3): qty 1

## 2022-03-17 MED ORDER — LORAZEPAM 1 MG PO TABS: 1.0000 mg | ORAL_TABLET | ORAL | Status: DC | PRN

## 2022-03-17 MED ORDER — HALOPERIDOL LACTATE 5 MG/ML IJ SOLN: 0.5000 mg | INTRAMUSCULAR | Status: DC | PRN

## 2022-03-17 NOTE — Progress Notes (Signed)
NGT removed per order. Pt tolerated well. Oral care completed post removal.

## 2022-03-17 NOTE — Progress Notes (Signed)
Patient has been alert this shift and has been trying to communicate, although difficult with previous dysphagia and ng tube.  Some words can be understood with careful listening. Patient has been bladder scanned and less than 100 cc of urine was shown on scan. Patient has had external urinary catheter and has been urinating independlty this shift.

## 2022-03-17 NOTE — Progress Notes (Signed)
Speech Language Pathology Treatment: Dysphagia  Patient Details Name: Thomas Huber MRN: [id number] DOB: [birth year] Today's Date: 03/17/2022 Time: 1914-7829 SLP Time Calculation (min) (ACUTE ONLY): 28 min  Assessment / Plan / Recommendation Clinical Impression  Pt seen for ongoing dysphagia intervention with NG in sutu for medication administration. Pt with improved alertness this date. He was restless throughout the visit and required max cues for attention to task. His speech is more intelligible today (asked what I was getting him for Christmas when I told him Thomas Huber Christmas, requested to go to [employer], and Dr. Reino Huber). He did not close his lips around spoon, straw or cup and SLP had to place bolus in posterior oral cavity and he continues to talk, but does eventually swallow. While he likely has some pharyngeal dysphagia, his main inhibitor to secretion management and PO intake is oral phase/cognitive based dysphagia. SLP provided finger occluded tip of straw sips of thins and nectars (which increases risk for aspiration, however swallow trigger did occur).   If family is considering hospice, would propose that NG be removed to see if this increases Pt's swallows and reduces open mouth posture breathing- could then plan to continue to administer his Sinement in a small amount of puree or ice cream and follow with liquid wash via ice chip presentation or finger occluded straw or small amount of nectar thick liquid on spoon. Could also consider comfort feeds if appropriate of puree and thin vs NTL if alert and upright with 100% assist from RN. Continue oral care. SLP will continue to follow   HPI HPI: 66 year old male with a history of Parkinson's disease presenting when he was found unresponsive in his apartment.  The last known well time was unclear.  History was unobtainable because of the patient's encephalopathy.  History is obtained from review of the medical record.  Apparently, EMS noted  the patient to have facial droop, slurred speech, left arm weakness.  On 03/11/2022 after arrival, the patient eventually became agitated and required sedation with IV Ativan.  An NG tube was subsequently placed because of his dysphagia.  Neurology was consulted.  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 BSE ordered.      SLP Plan  Continue with current plan of care      Recommendations for follow up therapy are one component of a multi-disciplinary discharge planning process, led by the attending physician.  Recommendations may be updated based on patient status, additional functional criteria and insurance authorization.    Recommendations  Diet recommendations: NPO Medication Administration: Crushed with puree Supervision: Staff to assist with self feeding;Full supervision/cueing for compensatory strategies Compensations: Slow rate;Small sips/bites Postural Changes and/or Swallow Maneuvers: Seated upright 90 degrees;Upright 30-60 min after meal                Oral Care Recommendations: Oral care prior to ice chip/H20;Staff/trained caregiver to provide oral care Follow Up Recommendations: Skilled nursing-short term rehab (<3 hours/day) Assistance recommended at discharge: Frequent or constant Supervision/Assistance SLP Visit Diagnosis: Dysphagia, unspecified (R13.10) Plan: Continue with current plan of care         Thank you,  Thomas Huber, CCC-SLP [phone]   Thomas Huber  03/17/2022, 11:43 AM

## 2022-03-17 NOTE — Progress Notes (Addendum)
Progress Note    Thomas Huber   [id number]  DOB: April 29, 1955  DOA: 03/17/2022     6 PCP: Glenda Chroman, MD  Initial CC: found down  Hospital Course: Thomas Huber is a 66 year old male with a history of Parkinson's disease who presented when he was found unresponsive in his apartment.  The last known well time was unclear.  History was unobtainable because of the patient's encephalopathy. Apparently, EMS noted the patient to have facial droop, slurred speech, left arm weakness.  On 03/11/2022 after arrival, the patient eventually became agitated and required sedation with IV Ativan.  An NG tube was subsequently placed because of his dysphagia.  Neurology was consulted.  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 Notably, the patient did have a long history of alcohol dependence previously, but quit slightly over 1 year prior to this admission. Due to unavailable family initially there was a DSS request for interm guardianship. After this, the patient's cousin was able to be reached and became involved in patient's care/decision making.   Interval History:  No events overnight.  Appearing about the same this morning.  May be slightly more awake but still has difficulty following most of my commands. Called and spoke with Thomas Huber this morning and update given.  Assessment and Plan:  Acute CVA -Appreciate Neurology Consult - dispo TBD; SNF vs CIR vs possibly even residential hospice  -CT brain--no acute abnormality -MRI brain--acute right MCA infarct with cytotoxic edema -MRA brain--right MCA M2 occlusion -Carotid Duplex--no hemodynamically significant stenosis -Echo--EF 60-65%, no WMA, grade 1 DD, no PFO - 14 day event monitor previously recommended, but will not change long-term outcome -LDL--85 -HbA1C--5.2 -Antiplatelet--ASA 81 + Plavix 75 mg x 3 weeks, then ASA 81 alone - poor PEG candidate and still dependent on  NGT for meds - see GOC for further   Goals of care -Currently has NG tube for meds -He has significant dysphagia and is currently unable to swallow.  Suspect that he also may be aspirating his oral secretions -Initially, no family available to address further goals/decision making and therefore DSS started interim guardianship - After DSS involvement, patient's cousin called to check on him and is now engaged in his care.  His cousin Thomas Huber reports that her and her husband were very involved in patient's care prior to his admission.  They would take him to his doctors appointments. In fact he had stayed with them for approximately a month after his mother's death. They relate that Thomas Huber truly liked his independence which is why he was still living independently, with Thomas Huber and her husband frequently checking on him.  Kim feels certain that Thomas Huber would not want any aggressive measures such as intubation or CPR at the end of his life.  She is strongly in support of DNR. -Dr. Roderic Palau reviewed the case with palliative care Dr. Hilma Favors, who's team has also been following.  Please refer to her note.  Considering patient's current mental status, neurologic deficits, high risk for complications such as recurrent aspiration pneumonia and underlying Parkinson's disease, it is agreed that CPR and intubation at the time of his death would inflict pain and suffering and unlikely provide any meaningful medical benefit.   - I agree with Dr. Delanna Ahmadi opinion that DNR would be appropriate in this situation.  His family also agrees with this recommendation and feel as though the patient would want the same -Furthermore, at  this point in time, patient is showing no signs of improvement or meaningful recovery.  Repeat SLP eval 6 days after admission on 03/17/2022 also showed significant risk for aspiration and patient unsafe for oral intake if remaining aggressive in medical treatment.  Patient has significant deficits from his  stroke with progressive functional decline and little chance if any of meaningful recovery.  He is expected to continue to have imminent decline and without adequate nutrition would approach end of life regardless ~ 2 weeks; and as noted above, he is not appropriate for PEG placement as this is deemed futile medical treatment and still will not result in meaningful recovery - therefore, after further discussion with palliative care and with discussion with Thomas Huber, the patient will be transitioned to comfort care; he will remain in hospital for likely an inpatient death, however if he remains stable enough for transfer to residential hospice, we possibly may pursue that as well   Acute metabolic encephalopathy -Secondary to ischemic stroke with cytotoxic edema and B12 deficiency -UA negative for pyuria -B12 --109>> supplementing -Folic acid 8.5 -Ammonia 24 -TSH 1.3   Dyslipidemia -discontinue statin in lieu of discussion above -LDL 85   Parkinson's disease -focusing on comfort    Alcohol dependence in remission - continue comfort care   Dysphagia -NG inserted -Patient noted to be very rhonchorous/gurgling despite not getting any significant tube feeding.  Suspect he is aspirating his oral secretions - okay to d/c NGT 12/24 and transition to comfort    Fever - Likely aspirating; speaks to guarded/poor prognosis from CVA - some fever appreciated - d/c unasyn  -Continue frequent suctioning from a comfort standpoint only   Old records reviewed in assessment of this patient  Antimicrobials: Unasyn 12/21 >> 12/24   DVT prophylaxis:  SCDs Start: 03/11/22 0447   Code Status:   Code Status: DNR  Mobility Assessment (last 72 hours)     Mobility Assessment     Row Name 03/17/22 0841 03/16/22 2200 03/16/22 0813 03/15/22 1215 03/15/22 1100   Does patient have an order for bedrest or is patient medically unstable No - Continue assessment No - Continue assessment No - Continue assessment  -- --   What is the highest level of mobility based on the progressive mobility assessment? Level 1 (Bedfast) - Unable to balance while sitting on edge of bed Level 2 (Chairfast) - Balance while sitting on edge of bed and cannot stand Level 2 (Chairfast) - Balance while sitting on edge of bed and cannot stand Level 4 (Walks with assist in room) - Balance while marching in place and cannot step forward and back - Complete Level 4 (Walks with assist in room) - Balance while marching in place and cannot step forward and back - Complete   Is the above level different from baseline mobility prior to current illness? Yes - Recommend PT order -- -- -- --    Row Name 03/15/22 7341 03/14/22 2036         Does patient have an order for bedrest or is patient medically unstable Yes- Bedfast (Level 1) - Complete No - Continue assessment      What is the highest level of mobility based on the progressive mobility assessment? -- Level 1 (Bedfast) - Unable to balance while sitting on edge of bed      Is the above level different from baseline mobility prior to current illness? -- Yes - Recommend PT order  Barriers to discharge:  Disposition Plan:  inpatient comfort care vs residential hospice Status is: Inpt  Objective: Blood pressure 134/77, pulse 95, temperature 97.8 F (36.6 C), temperature source Axillary, resp. rate 20, height 5\' 7"  (1.702 m), weight 86.2 kg, SpO2 100 %.  Examination:  Physical Exam Constitutional:      Comments: Chronically ill-appearing adult man lying in bed with active gurgling sounds and congestion  HENT:     Head: Normocephalic and atraumatic.     Nose:     Comments: NGT in place    Mouth/Throat:     Mouth: Mucous membranes are moist.  Eyes:     Extraocular Movements: Extraocular movements intact.  Cardiovascular:     Rate and Rhythm: Normal rate and regular rhythm.  Pulmonary:     Breath sounds: Rhonchi and rales present. No wheezing.     Comments:  Diffuse coarse breath sounds bilaterally Abdominal:     General: Bowel sounds are normal. There is no distension.     Palpations: Abdomen is soft.     Tenderness: There is no abdominal tenderness.  Musculoskeletal:        General: Swelling present.     Cervical back: Normal range of motion and neck supple.  Skin:    General: Skin is warm and dry.  Neurological:     Comments: Unable to speak much, only one-word answers.  Follows some commands.  Moves all 4 extremities but appreciated weakness involving left upper and lower extremity, approximately 4/5      Consultants:  Palliative care Ethics Neurology  Procedures:    Data Reviewed: Results for orders placed or performed during the hospital encounter of 03/24/2022 (from the past 24 hour(s))  Basic metabolic panel     Status: Abnormal   Collection Time: 03/17/22  3:52 AM  Result Value Ref Range   Sodium 143 135 - 145 mmol/L   Potassium 3.6 3.5 - 5.1 mmol/L   Chloride 113 (H) 98 - 111 mmol/L   CO2 24 22 - 32 mmol/L   Glucose, Bld 96 70 - 99 mg/dL   BUN 12 8 - 23 mg/dL   Creatinine, Ser 0.70 0.61 - 1.24 mg/dL   Calcium 8.1 (L) 8.9 - 10.3 mg/dL   GFR, Estimated >60 >60 mL/min   Anion gap 6 5 - 15  CBC with Differential/Platelet     Status: Abnormal   Collection Time: 03/17/22  3:52 AM  Result Value Ref Range   WBC 5.9 4.0 - 10.5 K/uL   RBC 4.01 (L) 4.22 - 5.81 MIL/uL   Hemoglobin 12.2 (L) 13.0 - 17.0 g/dL   HCT 36.3 (L) 39.0 - 52.0 %   MCV 90.5 80.0 - 100.0 fL   MCH 30.4 26.0 - 34.0 pg   MCHC 33.6 30.0 - 36.0 g/dL   RDW 12.6 11.5 - 15.5 %   Platelets 136 (L) 150 - 400 K/uL   nRBC 0.0 0.0 - 0.2 %   Neutrophils Relative % 72 %   Neutro Abs 4.3 1.7 - 7.7 K/uL   Lymphocytes Relative 18 %   Lymphs Abs 1.0 0.7 - 4.0 K/uL   Monocytes Relative 7 %   Monocytes Absolute 0.4 0.1 - 1.0 K/uL   Eosinophils Relative 2 %   Eosinophils Absolute 0.1 0.0 - 0.5 K/uL   Basophils Relative 1 %   Basophils Absolute 0.0 0.0 - 0.1 K/uL    Immature Granulocytes 0 %   Abs Immature Granulocytes 0.02 0.00 - 0.07 K/uL  Magnesium     Status: None   Collection Time: 03/17/22  3:52 AM  Result Value Ref Range   Magnesium 2.1 1.7 - 2.4 mg/dL    I have Reviewed nursing notes, Vitals, and Lab results since pt's last encounter. Pertinent lab results : see above I have ordered labwork to follow up on.  I have reviewed the last note from staff over past 24 hours I have discussed pt's care plan and test results with nursing staff, CM/SW, and other staff as appropriate  Time spent: Greater than 50% of the 55 minute visit was spent in counseling/coordination of care for the patient as laid out in the A&P.   LOS: 6 days   Dwyane Dee, MD [facility] 03/17/2022, 4:20 PM

## 2022-03-18 DIAGNOSIS — G9341 Metabolic encephalopathy: Secondary | ICD-10-CM

## 2022-03-18 DIAGNOSIS — I63511 Cerebral infarction due to unspecified occlusion or stenosis of right middle cerebral artery: Secondary | ICD-10-CM | POA: Diagnosis not present

## 2022-03-18 DIAGNOSIS — Z7189 Other specified counseling: Secondary | ICD-10-CM | POA: Diagnosis not present

## 2022-03-18 DIAGNOSIS — G20A1 Parkinson's disease without dyskinesia, without mention of fluctuations: Secondary | ICD-10-CM | POA: Diagnosis not present

## 2022-03-18 DIAGNOSIS — J690 Pneumonitis due to inhalation of food and vomit: Secondary | ICD-10-CM | POA: Diagnosis not present

## 2022-03-18 NOTE — Progress Notes (Signed)
Progress Note    Thomas Huber   [id number]  DOB: [birth year]  DOA: 03/04/2022     7 PCP: Thomas Specking, MD  Initial CC: found down  Hospital Course: Thomas Huber is a 66 year old male with a history of Parkinson's disease who presented when he was found unresponsive in his apartment.  The last known well time was unclear.  History was unobtainable because of the patient's encephalopathy. Apparently, EMS noted the patient to have facial droop, slurred speech, left arm weakness.  On 03/11/2022 after arrival, the patient eventually became agitated and required sedation with IV Ativan.  An NG tube was subsequently placed because of his dysphagia.  Neurology was consulted.  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 Notably, the patient did have a long history of alcohol dependence previously, but quit slightly over 1 year prior to this admission. Due to unavailable family initially there was a DSS request for interm guardianship. After this, the patient's cousin was able to be reached and became involved in patient's care/decision making.   Interval History:  No events overnight.  NGT removed yesterday. Foley placed.  Gurgling breathing this morning but is comfortable.   Assessment and Plan:  Acute CVA -Appreciate Neurology Consult - dispo TBD; SNF vs CIR vs possibly even residential hospice  -CT brain--no acute abnormality -MRI brain--acute right MCA infarct with cytotoxic edema -MRA brain--right MCA M2 occlusion -Carotid Duplex--no hemodynamically significant stenosis -Echo--EF 60-65%, no WMA, grade 1 DD, no PFO - 14 day event monitor previously recommended, but will not change long-term outcome -LDL--85 -HbA1C--5.2 -Antiplatelet--ASA 81 + Plavix 75 mg x 3 weeks, then ASA 81 alone - poor PEG candidate and still dependent on NGT for meds - see GOC for further   Goals of care -Currently has NG tube for  meds -He has significant dysphagia and is currently unable to swallow.  Suspect that he also may be aspirating his oral secretions -Initially, no family available to address further goals/decision making and therefore DSS started interim guardianship - After DSS involvement, patient's cousin called to check on him and is now engaged in his care.  His cousin Thomas Huber reports that her and her husband were very involved in patient's care prior to his admission.  They would take him to his doctors appointments. In fact he had stayed with them for approximately a month after his mother's death. They relate that Thomas Huber truly liked his independence which is why he was still living independently, with Thomas Huber and her husband frequently checking on him.  Thomas Huber feels certain that Thomas Huber would not want any aggressive measures such as intubation or CPR at the end of his life.  She is strongly in support of DNR. -Dr. Kerry Huber reviewed the case with palliative care Dr. Phillips Huber, who's team has also been following.  Please refer to her note.  Considering patient's current mental status, neurologic deficits, high risk for complications such as recurrent aspiration pneumonia and underlying Parkinson's disease, it is agreed that CPR and intubation at the time of his death would inflict pain and suffering and unlikely provide any meaningful medical benefit.   - I agree with Dr. Lamar Huber opinion that DNR would be appropriate in this situation.  His family also agrees with this recommendation and feel as though the patient would want the same -Furthermore, at this point in time, patient is showing no signs of improvement or meaningful recovery.  Repeat SLP  eval 6 days after admission on 03/17/2022 also showed significant risk for aspiration and patient unsafe for oral intake if remaining aggressive in medical treatment.  Patient has significant deficits from his stroke with progressive functional decline and little chance if any of meaningful  recovery.  He is expected to continue to have imminent decline and without adequate nutrition would approach end of life regardless ~ 2 weeks; and as noted above, he is not appropriate for PEG placement as this is deemed futile medical treatment and still will not result in meaningful recovery - therefore, after further discussion with palliative care and with discussion with Thomas Huber, the patient will be transitioned to comfort care; he will remain in hospital for likely an inpatient death, however if he remains stable enough for transfer to residential hospice, we possibly may pursue that as well   Acute metabolic encephalopathy -Secondary to ischemic stroke with cytotoxic edema and B12 deficiency -UA negative for pyuria -Folic acid 8.5, [id number] low -Ammonia 24 -TSH 1.3 - continue comfort care - continue foley   Dyslipidemia -discontinue statin in lieu of discussion above -LDL 85   Parkinson's disease -focusing on comfort    Alcohol dependence in remission - continue comfort care   Dysphagia -NG inserted -Patient noted to be very rhonchorous/gurgling despite not getting any significant tube feeding.  Suspect he is aspirating his oral secretions - okay to d/c NGT 12/24 and transition to comfort    Fever - Likely aspirating; speaks to guarded/poor prognosis from CVA - some fever appreciated - d/c unasyn  -Continue frequent suctioning from a comfort standpoint only   Old records reviewed in assessment of this patient  Antimicrobials: Unasyn 12/21 >> 12/24   DVT prophylaxis:     Code Status:   Code Status: DNR  Mobility Assessment (last 72 hours)     Mobility Assessment     Row Name 03/17/22 0841 03/16/22 2200 03/16/22 0813 03/15/22 1215     Does patient have an order for bedrest or is patient medically unstable No - Continue assessment No - Continue assessment No - Continue assessment --    What is the highest level of mobility based on the progressive mobility assessment?  Level 1 (Bedfast) - Unable to balance while sitting on edge of bed Level 2 (Chairfast) - Balance while sitting on edge of bed and cannot stand Level 2 (Chairfast) - Balance while sitting on edge of bed and cannot stand Level 4 (Walks with assist in room) - Balance while marching in place and cannot step forward and back - Complete    Is the above level different from baseline mobility prior to current illness? Yes - Recommend PT order -- -- --             Barriers to discharge:  Disposition Plan:  inpatient comfort care vs residential hospice Status is: Inpt  Objective: Blood pressure 136/77, pulse 72, temperature 98 F (36.7 C), resp. rate 17, height 5\' 7"  (1.702 m), weight 86.2 kg, SpO2 100 %.  Examination:  Physical Exam Constitutional:      Comments: Chronically ill-appearing adult man lying in bed with active gurgling sounds and congestion  HENT:     Head: Normocephalic and atraumatic.     Nose:     Comments: NGT gone    Mouth/Throat:     Mouth: Mucous membranes are moist.  Eyes:     Extraocular Movements: Extraocular movements intact.  Cardiovascular:     Rate and Rhythm: Normal rate and regular  rhythm.  Pulmonary:     Breath sounds: Rhonchi and rales present. No wheezing.     Comments: Diffuse coarse breath sounds bilaterally Abdominal:     General: Bowel sounds are normal. There is no distension.     Palpations: Abdomen is soft.     Tenderness: There is no abdominal tenderness.  Musculoskeletal:        General: Swelling present.     Cervical back: Normal range of motion and neck supple.  Skin:    General: Skin is warm and dry.  Neurological:     Comments: Stares at me; does not follow commands and is nonverbal      Consultants:  Palliative care Ethics Neurology  Procedures:    Data Reviewed: No results found for this or any previous visit (from the past 24 hour(s)).   I have Reviewed nursing notes, Vitals, and Lab results since pt's last encounter.  Pertinent lab results : see above I have ordered labwork to follow up on.  I have reviewed the last note from staff over past 24 hours I have discussed pt's care plan and test results with nursing staff, CM/SW, and other staff as appropriate  Time spent: Greater than 50% of the 55 minute visit was spent in counseling/coordination of care for the patient as laid out in the A&P.   LOS: 7 days   Dwyane Dee, MD [facility] 03/18/2022, 11:45 AM

## 2022-03-19 DIAGNOSIS — J690 Pneumonitis due to inhalation of food and vomit: Secondary | ICD-10-CM | POA: Diagnosis not present

## 2022-03-19 DIAGNOSIS — I63511 Cerebral infarction due to unspecified occlusion or stenosis of right middle cerebral artery: Secondary | ICD-10-CM | POA: Diagnosis not present

## 2022-03-19 DIAGNOSIS — Z7189 Other specified counseling: Secondary | ICD-10-CM | POA: Diagnosis not present

## 2022-03-19 DIAGNOSIS — G20A1 Parkinson's disease without dyskinesia, without mention of fluctuations: Secondary | ICD-10-CM | POA: Diagnosis not present

## 2022-03-19 LAB — CULTURE, BLOOD (ROUTINE X 2)
Culture: NO GROWTH
Culture: NO GROWTH
Special Requests: ADEQUATE

## 2022-03-19 LAB — VITAMIN B1: Vitamin B1 (Thiamine): 112.1 nmol/L (ref 66.5–200.0)

## 2022-03-19 NOTE — Progress Notes (Signed)
New dressing applied to sacrum and turned to side with pillow.  Alert and resistive to care and being turned, etc.    Cousin-in-law at bedside.

## 2022-03-19 NOTE — Progress Notes (Signed)
Patient has been tugging at this foley despite having mittens on. Bloody discharge noted around foley. Placed hands on top of cover and tucked covers under bed to help stop from the tugging.

## 2022-03-19 NOTE — Progress Notes (Signed)
When asked name tried to speak and did follow commands to raise arm slightly.  Mouth care performed

## 2022-03-19 NOTE — Progress Notes (Signed)
Progress Note    Levert Heslop   YQM:[id number]  DOB: Apr 01, 1955  DOA: 03/03/2022     8 PCP: Ignatius Specking, MD  Initial CC: found down  Hospital Course: Mr. Staley is a 66 year old male with a history of Parkinson's disease who presented when he was found unresponsive in his apartment.  The last known well time was unclear.  History was unobtainable because of the patient's encephalopathy. Apparently, EMS noted the patient to have facial droop, slurred speech, left arm weakness.  On 03/11/2022 after arrival, the patient eventually became agitated and required sedation with IV Ativan.  An NG tube was subsequently placed because of his dysphagia.  Neurology was consulted.  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 Notably, the patient did have a long history of alcohol dependence previously, but quit slightly over 1 year prior to this admission. Due to unavailable family initially there was a DSS request for interm guardianship. After this, the patient's cousin was able to be reached and became involved in patient's care/decision making.   Interval History:  No events overnight.  Laying in bed with mittens in place. Follows some commands this am (raises arms), but not for moving legs.  No PO intake and still very congested sounding. Cannot talk but trying to.   Assessment and Plan:  Acute CVA -Appreciate Neurology Consult - dispo TBD; SNF vs CIR vs possibly even residential hospice  -CT brain--no acute abnormality -MRI brain--acute right MCA infarct with cytotoxic edema -MRA brain--right MCA M2 occlusion -Carotid Duplex--no hemodynamically significant stenosis -Echo--EF 60-65%, no WMA, grade 1 DD, no PFO - 14 day event monitor previously recommended, but will not change long-term outcome -LDL--85 -HbA1C--5.2 -Antiplatelet--ASA 81 + Plavix 75 mg x 3 weeks, then ASA 81 alone - poor PEG candidate and was dependent  on NGT for meds - see GOC for further   Goals of care -Currently has NG tube for meds -He has significant dysphagia and is currently unable to swallow.  Suspect that he also may be aspirating his oral secretions -Initially, no family available to address further goals/decision making and therefore DSS started interim guardianship - After DSS involvement, patient's cousin called to check on him and is now engaged in his care.  His cousin Selena Batten reports that her and her husband were very involved in patient's care prior to his admission.  They would take him to his doctors appointments. In fact he had stayed with them for approximately a month after his mother's death. They relate that Taevyn truly liked his independence which is why he was still living independently, with Selena Batten and her husband frequently checking on him.  Kim feels certain that Shloime would not want any aggressive measures such as intubation or CPR at the end of his life.  She is strongly in support of DNR. -Dr. Kerry Hough reviewed the case with palliative care Dr. Phillips Odor, who's team has also been following.  Please refer to her note.  Considering patient's current mental status, neurologic deficits, high risk for complications such as recurrent aspiration pneumonia and underlying Parkinson's disease, it is agreed that CPR and intubation at the time of his death would inflict pain and suffering and unlikely provide any meaningful medical benefit.   - I agree with Dr. Lamar Blinks opinion that DNR would be appropriate in this situation.  His family also agrees with this recommendation and feel as though the patient would want the  same -Furthermore, at this point in time, patient is showing no signs of improvement or meaningful recovery.  Repeat SLP eval 6 days after admission on 03/17/2022 also showed significant risk for aspiration and patient unsafe for oral intake if remaining aggressive in medical treatment.  Patient has significant deficits from  his stroke with progressive functional decline and little chance if any of meaningful recovery.  He is expected to continue to have imminent decline and without adequate nutrition would approach end of life regardless ~ 2 weeks; and as noted above, he is not appropriate for PEG placement as this is deemed futile medical treatment and still will not result in meaningful recovery - therefore, after further discussion with palliative care and with discussion with Selena Batten, the patient will be transitioned to comfort care; he will remain in hospital for likely an inpatient death, however if he remains stable enough for transfer to residential hospice, we possibly may pursue that as well   Acute metabolic encephalopathy -Secondary to ischemic stroke with cytotoxic edema and B12 deficiency -UA negative for pyuria -Folic acid 8.5, B12 low -Ammonia 24 -TSH 1.3 - continue comfort care - continue foley   Dyslipidemia -discontinue statin in lieu of discussion above -LDL 85   Parkinson's disease -focusing on comfort    Alcohol dependence in remission - continue comfort care   Dysphagia -NG inserted -Patient noted to be very rhonchorous/gurgling despite not getting any significant tube feeding.  Suspect he is aspirating his oral secretions - okay to d/c NGT 12/24 and transition to comfort    Fever - Likely aspirating; speaks to guarded/poor prognosis from CVA - some fever appreciated - d/c unasyn  -Continue frequent suctioning from a comfort standpoint only   Old records reviewed in assessment of this patient  Antimicrobials: Unasyn 12/21 >> 12/24   DVT prophylaxis:     Code Status:   Code Status: DNR  Mobility Assessment (last 72 hours)     Mobility Assessment     Row Name 03/19/22 0900 03/18/22 2100 03/17/22 0841 03/16/22 2200     Does patient have an order for bedrest or is patient medically unstable Yes- Bedfast (Level 1) - Complete Yes- Bedfast (Level 1) - Complete No - Continue  assessment No - Continue assessment    What is the highest level of mobility based on the progressive mobility assessment? Level 1 (Bedfast) - Unable to balance while sitting on edge of bed Level 1 (Bedfast) - Unable to balance while sitting on edge of bed Level 1 (Bedfast) - Unable to balance while sitting on edge of bed Level 2 (Chairfast) - Balance while sitting on edge of bed and cannot stand    Is the above level different from baseline mobility prior to current illness? -- -- Yes - Recommend PT order --             Barriers to discharge:  Disposition Plan:  inpatient comfort care vs residential hospice Status is: Inpt  Objective: Blood pressure (!) 150/68, pulse 70, temperature 99.8 F (37.7 C), temperature source Axillary, resp. rate (!) 32, height 5\' 7"  (1.702 m), weight 86.2 kg, SpO2 96 %.  Examination:  Physical Exam Constitutional:      Comments: Chronically ill-appearing adult man lying in bed with active gurgling sounds and congestion  HENT:     Head: Normocephalic and atraumatic.     Nose:     Comments: NGT gone    Mouth/Throat:     Mouth: Mucous membranes are moist.  Eyes:     Extraocular Movements: Extraocular movements intact.  Cardiovascular:     Rate and Rhythm: Normal rate and regular rhythm.  Pulmonary:     Breath sounds: Rhonchi and rales present. No wheezing.     Comments: Diffuse coarse breath sounds bilaterally Abdominal:     General: Bowel sounds are normal. There is no distension.     Palpations: Abdomen is soft.     Tenderness: There is no abdominal tenderness.  Musculoskeletal:        General: Swelling present.     Cervical back: Normal range of motion and neck supple.  Skin:    General: Skin is warm and dry.  Neurological:     Comments: Stares at me; followed a few more commands this morning with his upper extremities      Consultants:  Palliative care Ethics Neurology  Procedures:    Data Reviewed: No results found for this or  any previous visit (from the past 24 hour(s)).   I have Reviewed nursing notes, Vitals, and Lab results since pt's last encounter. Pertinent lab results : see above I have ordered labwork to follow up on.  I have reviewed the last note from staff over past 24 hours I have discussed pt's care plan and test results with nursing staff, CM/SW, and other staff as appropriate  Time spent: Greater than 50% of the 55 minute visit was spent in counseling/coordination of care for the patient as laid out in the A&P.   LOS: 8 days   Lewie Chamber, MD [facility] 03/19/2022, 1:31 PM

## 2022-03-20 DIAGNOSIS — I63511 Cerebral infarction due to unspecified occlusion or stenosis of right middle cerebral artery: Secondary | ICD-10-CM | POA: Diagnosis not present

## 2022-03-20 NOTE — Plan of Care (Signed)
  Problem: Safety: Goal: Non-violent Restraint(s) Outcome: Not Applicable   

## 2022-03-20 NOTE — Progress Notes (Signed)
Progress Note    Thomas Huber   VQX:[id number]  DOB: 08-Sep-1955  DOA: 03/07/2022     9 PCP: Ignatius Specking, MD  Initial CC: found down  Hospital Course: Thomas Huber is a 66 year old male with a history of Parkinson's disease who presented when he was found unresponsive in his apartment.  The last known well time was unclear.  History was unobtainable because of the patient's encephalopathy. Apparently, EMS noted the patient to have facial droop, slurred speech, left arm weakness.  On 03/11/2022 after arrival, the patient eventually became agitated and required sedation with IV Ativan.  An NG tube was subsequently placed because of his dysphagia.  Neurology was consulted.  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 Notably, the patient did have a long history of alcohol dependence previously, but quit slightly over 1 year prior to this admission. Due to unavailable family initially there was a DSS request for interm guardianship. After this, the patient's cousin was able to be reached and became involved in patient's care/decision making.   Interval History:  No events overnight.  -Appears peaceful, -No significant oral intake -Mostly sleepy  Assessment and Plan:  Acute CVA -Appreciate Neurology Consult - dispo TBD; SNF Vs residential hospice  -CT brain--no acute abnormality -MRI brain--acute right MCA infarct with cytotoxic edema -MRA brain--right MCA M2 occlusion -Carotid Duplex--no hemodynamically significant stenosis -Echo--EF 60-65%, no WMA, grade 1 DD, no PFO - 14 day event monitor previously recommended, but will not change long-term outcome -LDL--85 -HbA1C--5.2 -Antiplatelet--ASA 81 + Plavix 75 mg x 3 weeks, then ASA 81 alone - poor PEG candidate and was dependent on NGT for meds - see GOC for further  -Difficulty with oral medications persist -Difficulty with oral intake/food intake persist  Goals  of care -He has significant dysphagia and is currently unable to swallow.  Suspect that he also may be aspirating his oral secretions -Initially, no family available to address further goals/decision making and therefore DSS started interim guardianship - After DSS involvement, patient's cousin called to check on him and is now engaged in his care.  His cousin Selena Batten reports that her and her husband were very involved in patient's care prior to his admission.  They would take him to his doctors appointments. In fact he had stayed with them for approximately a month after his mother's death. They relate that Thomas Huber truly liked his independence which is why he was still living independently, with Selena Batten and her husband frequently checking on him.  Thomas Huber feels certain that Thomas Huber would not want any aggressive measures such as intubation or CPR at the end of his life.  She is strongly in support of DNR. -Dr. Kerry Hough reviewed the case with palliative care Dr. Phillips Odor, who's team has also been following.  Please refer to her note.  Considering patient's current mental status, neurologic deficits, high risk for complications such as recurrent aspiration pneumonia and underlying Parkinson's disease, it is agreed that CPR and intubation at the time of his death would inflict pain and suffering and unlikely provide any meaningful medical benefit.   - I agree with Dr. Lamar Blinks opinion that DNR would be appropriate in this situation.  His family also agrees with this recommendation and feel as though the patient would want the same -Furthermore, at this point in time, patient is showing no signs of improvement or meaningful recovery.  Repeat SLP eval 6 days after admission  on 03/17/2022 also showed significant risk for aspiration and patient unsafe for oral intake if remaining aggressive in medical treatment.  Patient has significant deficits from his stroke with progressive functional decline and little chance if any of  meaningful recovery.  He is expected to continue to have imminent decline and without adequate nutrition would approach end of life regardless ~ 2 weeks; and as noted above, he is not appropriate for PEG placement as this is deemed futile medical treatment and still will not result in meaningful recovery - therefore, after further discussion with palliative care and with discussion with Selena Batten, the patient will be transitioned to comfort care; he will remain in hospital for likely an inpatient death, however if he remains stable enough for transfer to residential hospice, we possibly may pursue that as well -Given scopolamine patch due to excessive secretions -May use as needed Robinul   Acute metabolic encephalopathy -Secondary to ischemic stroke with cytotoxic edema and B12 deficiency -UA negative for pyuria -Folic acid 8.5, B12 low -Ammonia 24 -TSH 1.3 - continue comfort care - continue foley   Dyslipidemia -discontinue statin in lieu of discussion above -LDL 85   Parkinson's disease -focusing on comfort    Alcohol dependence in remission - continue comfort care   Dysphagia -Patient noted to be very rhonchorous/gurgling despite not getting any significant tube feeding.  Suspect he is aspirating his oral secretions -  NG Tube dced 12/24 and transition to comfort     Fever - Likely aspirating; speaks to guarded/poor prognosis from CVA - some fever appreciated - d/ced unasyn  -Continue frequent suctioning from a comfort standpoint only   Old records reviewed in assessment of this patient  Antimicrobials: Unasyn 12/21 >> 12/24   DVT prophylaxis:  --Comfort care   Code Status:   Code Status: DNR  Mobility Assessment (last 72 hours)     Mobility Assessment     Row Name 03/20/22 0011 03/19/22 0900 03/18/22 2100       Does patient have an order for bedrest or is patient medically unstable Yes- Bedfast (Level 1) - Complete Yes- Bedfast (Level 1) - Complete Yes- Bedfast  (Level 1) - Complete     What is the highest level of mobility based on the progressive mobility assessment? Level 1 (Bedfast) - Unable to balance while sitting on edge of bed Level 1 (Bedfast) - Unable to balance while sitting on edge of bed Level 1 (Bedfast) - Unable to balance while sitting on edge of bed     Is the above level different from baseline mobility prior to current illness? No - Consider discontinuing PT/OT -- --              Barriers to discharge:  Disposition Plan:  inpatient comfort care vs residential hospice Status is: Inpt  Objective: Blood pressure 138/80, pulse 85, temperature 99 F (37.2 C), temperature source Axillary, resp. rate 20, height 5\' 7"  (1.702 m), weight 86.2 kg, SpO2 93 %.   Physical Exam  Gen:-Resting comfortably, lethargic  HEENT:- [place].AT, No sclera icterus Neck-Supple Neck,No JVD,.  Lungs-diminished breath sounds,, sounds rhonchorous/congested CV- S1, S2 normal, RRR Abd-  +ve B.Sounds, Abd Soft, No tenderness,    Extremity/Skin:- No  edema,   good pedal pulses  Neuro-Psych-less lethargic with occasional episodes of restlessness, stroke related deficits  GU-Foley in situ  Consultants:  Palliative care Ethics Neurology   LOS: 9 days   , MD [facility] 03/20/2022, 11:21 AM

## 2022-03-21 DIAGNOSIS — Z7189 Other specified counseling: Secondary | ICD-10-CM | POA: Diagnosis not present

## 2022-03-21 DIAGNOSIS — I63511 Cerebral infarction due to unspecified occlusion or stenosis of right middle cerebral artery: Secondary | ICD-10-CM | POA: Diagnosis not present

## 2022-03-21 DIAGNOSIS — Z515 Encounter for palliative care: Secondary | ICD-10-CM | POA: Diagnosis not present

## 2022-03-21 MED ORDER — MORPHINE SULFATE (PF) 2 MG/ML IV SOLN
2.0000 mg | INTRAVENOUS | Status: DC | PRN
  Administered 2022-03-22 – 2022-03-26 (×10): 2 mg via INTRAVENOUS
  Filled 2022-03-21 (×11): qty 1

## 2022-03-21 NOTE — Progress Notes (Addendum)
Palliative: Chart review completed.  He was admitted 12/17 with acute CVA and has been experiencing significant dysphagia, currently unable to swallow.  There is concern that he may be aspirating oral secretions.   After much discussion, next of kin/HCPOA, Thomas Huber, elected comfort and dignity at end-of-life.  End-of-life order set has been implemented.  Per notes, anticipate in-hospital death.  Conference with attending, bedside nursing staff, transition of care team related to patient condition, needs, goals of care, disposition.  HCPOA: Thomas Huber cousin, Thomas Huber, has agreed to be his healthcare surrogate.  Thomas Huber shares that she and her husband were involved with Thomas Huber care, transporting him to medical appointments.  It seems that there is to be a guardianship hearing on March 28, 2022.  Conference with attending, bedside nursing staff, transition of care team related to patient condition, needs, goals of care, disposition. Addendum: Symptom management needs addressed, orders adjusted.  Plan:    Full comfort care.  End-of-life order set in place. Prognosis:  Anticipate hours to days.  Hospital death would not be surprising.  25 minutes  Thomas Carmel, NP Palliative medicine team Team phone [phone] Greater than 50% of this time was spent counseling and coordinating care related to the above assessment and plan.

## 2022-03-21 NOTE — TOC Progression Note (Signed)
Transition of Care [facility]) - Progression Note    Patient Details  Name: Thomas Huber MRN: [id number] Date of Birth: 1955-07-31  Transition of Care [facility] CM/SW Contact  Villa Herb, [place] Phone Number: 03/21/2022, 11:49 AM  Clinical Narrative:    Guardianship hearing will be Jan 4 at [facility]. CSW to follow up with DSS to see if there are any updates. TOC to follow.   Expected Discharge Plan: IP Rehab Facility Barriers to Discharge: Continued Medical Work up  Expected Discharge Plan and Services In-house Referral: Clinical Social Work     Living arrangements for the past 2 months: Apartment                                       Social Determinants of Health (SDOH) Interventions SDOH Screenings   Food Insecurity: No Food Insecurity (03/11/2022)  Housing: Low Risk  (03/11/2022)  Transportation Needs: No Transportation Needs (03/11/2022)  Utilities: Not At Risk (03/11/2022)  Tobacco Use: Low Risk  (20-Mar-2022)    Readmission Risk Interventions     No data to display

## 2022-03-21 NOTE — Progress Notes (Signed)
Progress Note    Thomas Huber   [id number]  DOB: June 13, 1955  DOA: 03/13/2022     10 PCP: Glenda Chroman, MD  Initial CC: found down  Hospital Course: [name] is a 66 year old male with a history of Parkinson's disease who presented when he was found unresponsive in his apartment.  The last known well time was unclear.  History was unobtainable because of the patient's encephalopathy. Apparently, EMS noted the patient to have facial droop, slurred speech, left arm weakness.  On 03/11/2022 after arrival, the patient eventually became agitated and required sedation with IV Ativan.  An NG tube was subsequently placed because of his dysphagia.  Neurology was consulted.  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 Notably, the patient did have a long history of alcohol dependence previously, but quit slightly over 1 year prior to this admission. Due to unavailable family initially there was a DSS request for interm guardianship. After this, the patient's cousin was able to be reached and became involved in patient's care/decision making.   Interval History:  - Appears comfortable, - No fevers --No significant oral intake  Assessment and Plan:  Acute CVA -Appreciate Neurology Consult - dispo TBD; SNF Vs residential hospice  -CT brain--no acute abnormality -MRI brain--acute right MCA infarct with cytotoxic edema -MRA brain--right MCA M2 occlusion -Carotid Duplex--no hemodynamically significant stenosis -Echo--EF 60-65%, no WMA, grade 1 DD, no PFO - 14 day event monitor previously recommended, but will not change long-term outcome -LDL--85 -HbA1C--5.2 -Antiplatelet--ASA 81 + Plavix 75 mg x 3 weeks, then ASA 81 alone - poor PEG candidate and was dependent on NGT for meds - see GOC for further  -Difficulty with oral medications persist -Difficulty with oral intake/food intake persist  Goals of care -He has  significant dysphagia and is currently unable to swallow.  Suspect that he also may be aspirating his oral secretions -Initially, no family available to address further goals/decision making and therefore DSS started interim guardianship - After DSS involvement, patient's cousin called to check on him and is now engaged in his care.  His cousin Maudie Mercury reports that her and her husband were very involved in patient's care prior to his admission.  They would take him to his doctors appointments. In fact he had stayed with them for approximately a month after his mother's death. They relate that Alijiah truly liked his independence which is why he was still living independently, with Maudie Mercury and her husband frequently checking on him.  Kim feels certain that Thomas Huber would not want any aggressive measures such as intubation or CPR at the end of his life.  She is strongly in support of DNR. -Dr. Roderic Palau reviewed the case with palliative care Dr. Hilma Favors, who's team has also been following.  Please refer to her note.  Considering patient's current mental status, neurologic deficits, high risk for complications such as recurrent aspiration pneumonia and underlying Parkinson's disease, it is agreed that CPR and intubation at the time of his death would inflict pain and suffering and unlikely provide any meaningful medical benefit.   - Dr. Sabino Gasser agreed with Dr. Delanna Ahmadi opinion that DNR would be appropriate in this situation.  His family also agrees with this recommendation and feel as though the patient would want the same -Furthermore, at this point in time, patient is showing no signs of improvement or meaningful recovery.  Repeat SLP eval 6 days after admission on  03/17/2022 also showed significant risk for aspiration and patient unsafe for oral intake if remaining aggressive in medical treatment.  Patient has significant deficits from his stroke with progressive functional decline and little chance if any of meaningful  recovery.  He is expected to continue to have imminent decline and without adequate nutrition would approach end of life regardless ~ 2 weeks; and as noted above, he is not appropriate for PEG placement as this is deemed futile medical treatment and still will not result in meaningful recovery - therefore, after further discussion with palliative care and with discussion with Selena Batten, the patient will be transitioned to comfort care; he will remain in hospital for likely an inpatient death, however if he remains stable enough for transfer to residential hospice, we possibly may pursue that as well -c/n scopolamine patch due to excessive secretions -c/n  as needed Robinul   Acute metabolic encephalopathy -Secondary to ischemic stroke with cytotoxic edema and B12 deficiency -UA negative for pyuria -Folic acid 8.5, B12 low -Ammonia 24 -TSH 1.3 - continue comfort care - continue foley   Dyslipidemia -discontinue statin in lieu of discussion above -LDL 85   Parkinson's disease -focusing on comfort    Alcohol dependence in remission - continue comfort care   Dysphagia - Suspect he is aspirating his oral secretions -  NG Tube dced 12/24 and transitioning to comfort    Antimicrobials: Unasyn 12/21 >> 12/24   DVT prophylaxis:  --Comfort care   Code Status:   Code Status: DNR  Mobility Assessment (last 72 hours)     Mobility Assessment     Row Name 03/20/22 0011 03/19/22 0900 03/18/22 2100       Does patient have an order for bedrest or is patient medically unstable Yes- Bedfast (Level 1) - Complete Yes- Bedfast (Level 1) - Complete Yes- Bedfast (Level 1) - Complete     What is the highest level of mobility based on the progressive mobility assessment? Level 1 (Bedfast) - Unable to balance while sitting on edge of bed Level 1 (Bedfast) - Unable to balance while sitting on edge of bed Level 1 (Bedfast) - Unable to balance while sitting on edge of bed     Is the above level different  from baseline mobility prior to current illness? No - Consider discontinuing PT/OT -- --              Barriers to discharge: Decision making issues Disposition Plan:  inpatient comfort care vs residential hospice Status is: Inpt  Objective: Blood pressure (!) 143/77, pulse 88, temperature 98.4 F (36.9 C), resp. rate (!) 24, height 5\' 7"  (1.702 m), weight 86.2 kg, SpO2 93 %.   Physical Exam  Gen:-Resting comfortably, lethargic  HEENT:- [place].AT, No sclera icterus Neck-Supple Neck,No JVD,.  Lungs-fair air movement, no wheezing CV- S1, S2 normal, RRR Abd-  +ve B.Sounds, Abd Soft, No tenderness,    Extremity/Skin:- No  edema,   good pedal pulses  Neuro-Psych-less lethargic with occasional episodes of restlessness, stroke related deficits  GU-Foley in situ  Consultants:  Palliative care Ethics Neurology   LOS: 10 days   , MD [facility] 03/21/2022, 10:48 AM

## 2022-03-22 DIAGNOSIS — I63511 Cerebral infarction due to unspecified occlusion or stenosis of right middle cerebral artery: Secondary | ICD-10-CM | POA: Diagnosis not present

## 2022-03-22 NOTE — Progress Notes (Signed)
Patient is resting in bed at this time. No prn medications given during this shift. Patient is comfort care. Mouth care performed on patient, patient tolerated well.

## 2022-03-22 NOTE — Progress Notes (Signed)
Progress Note    Thomas Huber   [id number]:[id number]  DOB: 1955/04/01  DOA: Mar 28, 2022     11 PCP: Thomas Specking, MD  Initial CC: found down  Hospital Course: [name] is a 66 year old male with a history of Parkinson's disease who presented when he was found unresponsive in his apartment.  The last known well time was unclear.  History was unobtainable because of the patient's encephalopathy. Apparently, EMS noted the patient to have facial droop, slurred speech, left arm weakness.  On 03/11/2022 after arrival, the patient eventually became agitated and required sedation with IV Ativan.  An NG tube was subsequently placed because of his dysphagia.  Neurology was consulted.  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 Notably, the patient did have a long history of alcohol dependence previously, but quit slightly over 1 year prior to this admission. Due to unavailable family initially there was a DSS request for interm guardianship. After this, the patient's cousin was able to be reached and became involved in patient's care/decision making.   Interval History:  - -Comfortable, no new changes in status - guardianship case in court on January 4th,  2024  Assessment and Plan:  1)Acute CVA -Appreciate Neurology Consult - dispo TBD; SNF Vs residential hospice  -MRI brain--acute right MCA infarct with cytotoxic edema -MRA brain--right MCA M2 occlusion -Carotid Duplex--no hemodynamically significant stenosis -Echo--EF 60-65%, no WMA, grade 1 DD, no PFO - 14 day event monitor previously recommended, but will not change long-term outcome -LDL--85 -HbA1C--5.2 -Antiplatelet--ASA 81 + Plavix 75 mg x 3 weeks, then ASA 81 alone - poor PEG candidate and was dependent on NGT for meds - see GOC for further  -Difficulty with oral medications persist -Difficulty with oral intake/food intake persist  2)Social/Ethics/Goals of  care -He has significant dysphagia and is currently unable to swallow.  Suspect that he also may be aspirating his oral secretions -Initially, no family available to address further goals/decision making and therefore DSS started interim guardianship - After DSS involvement, patient's cousin called to check on him and is now engaged in his care.  His cousin Thomas Huber reports that her and her husband were very involved in patient's care prior to his admission.  They would take him to his doctors appointments. In fact he had stayed with them for approximately a month after his mother's death. They relate that [name] truly liked his independence which is why he was still living independently, with Thomas Huber and her husband frequently checking on him.  Kim feels certain that Thomas Huber would not want any aggressive measures such as intubation or CPR at the end of his life.  She is strongly in support of DNR. -Dr. Kerry Huber reviewed the case with palliative care Thomas Huber, who's team has also been following.  Please refer to her note.  Considering patient's current mental status, neurologic deficits, high risk for complications such as recurrent aspiration pneumonia and underlying Parkinson's disease, it is agreed that CPR and intubation at the time of his death would inflict pain and suffering and unlikely provide any meaningful medical benefit.   - Dr. Frederick Huber agreed with Dr. Lamar Huber opinion that DNR would be appropriate in this situation.  His family also agrees with this recommendation and feel as though the patient would want the same -Furthermore, at this point in time, patient is showing no signs of improvement or meaningful recovery.  Repeat SLP eval 6 days  after admission on 03/17/2022 also showed significant risk for aspiration and patient unsafe for oral intake if remaining aggressive in medical treatment.  Patient has significant deficits from his stroke with progressive functional decline and little chance if any of  meaningful recovery.  He is expected to continue to have imminent decline and without adequate nutrition would approach end of life regardless ~ 2 weeks; and as noted above, he is not appropriate for PEG placement as this is deemed futile medical treatment and still will not result in meaningful recovery - therefore, after further discussion with palliative care and with discussion with Thomas Huber, the patient was transitioned to comfort care; he will remain in hospital for likely an inpatient death, however if he remains stable enough for transfer to residential hospice, we possibly may pursue that as well -c/n scopolamine patch due to excessive secretions -c/n  as needed Robinul - guardianship case in court on January 4th,  2024   3)Acute metabolic encephalopathy -Secondary to ischemic stroke with cytotoxic edema and B12 deficiency -UA negative for pyuria -Folic acid 8.5, B12 low -Ammonia 24 -TSH 1.3 - continue comfort care - continue foley   Dyslipidemia -discontinue statin in lieu of discussion above -LDL 85   Parkinson's disease -focusing on comfort    Alcohol dependence in remission - continue comfort care   Dysphagia - Suspect he is aspirating his oral secretions -  NG Tube dced 12/24 and transitioning to comfort    Antimicrobials: Unasyn 12/21 >> 12/24   DVT prophylaxis:  --Comfort care   Code Status:   Code Status: DNR  Mobility Assessment (last 72 hours)     Mobility Assessment     Row Name 03/21/22 2058 03/20/22 0011         Does patient have an order for bedrest or is patient medically unstable Yes- Bedfast (Level 1) - Complete Yes- Bedfast (Level 1) - Complete      What is the highest level of mobility based on the progressive mobility assessment? Level 1 (Bedfast) - Unable to balance while sitting on edge of bed Level 1 (Bedfast) - Unable to balance while sitting on edge of bed      Is the above level different from baseline mobility prior to current illness?  No - Consider discontinuing PT/OT No - Consider discontinuing PT/OT              Barriers to discharge: Decision making issues--- guardianship case in court on January 4th,  2024 Disposition Plan:  inpatient comfort care vs residential hospice Status is: Inpt  Objective: Blood pressure 133/68, pulse 85, temperature 98.3 F (36.8 C), resp. rate (!) 26, height 5\' 7"  (1.702 m), weight 86.2 kg, SpO2 91 %.   Physical Exam  Gen:-Resting comfortably, lethargic  HEENT:- [place].AT, No sclera icterus Neck-Supple Neck,No JVD,.  Lungs-fair air movement, no wheezing CV- S1, S2 normal, RRR Abd-  +ve B.Sounds, Abd Soft, No tenderness,    Extremity/Skin:- No  edema,   good pedal pulses  Neuro-Psych-less lethargic with occasional episodes of restlessness, stroke related deficits  GU-Foley in situ  Consultants:  Palliative care Ethics Neurology   LOS: 11 days   , MD [facility] 03/22/2022, 11:48 AM

## 2022-03-23 DIAGNOSIS — I63511 Cerebral infarction due to unspecified occlusion or stenosis of right middle cerebral artery: Secondary | ICD-10-CM | POA: Diagnosis not present

## 2022-03-23 NOTE — Progress Notes (Signed)
Progress Note    Thomas Huber   [id number]  DOB: 26-Jan-1956  DOA: 03/01/2022     12 PCP: Thomas Specking, MD  Initial CC: found down  Hospital Course: Thomas Huber is a 66 year old male with a history of Parkinson's disease who presented when he was found unresponsive in his apartment.  The last known well time was unclear.  History was unobtainable because of the patient's encephalopathy. Apparently, EMS noted the patient to have facial droop, slurred speech, left arm weakness.  On 03/11/2022 after arrival, the patient eventually became agitated and required sedation with IV Ativan.  An NG tube was subsequently placed because of his dysphagia.  Neurology was consulted.  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 Notably, the patient did have a long history of alcohol dependence previously, but quit slightly over 1 year prior to this admission. Due to unavailable family initially there was a DSS request for interm guardianship. After this, the patient's cousin was able to be reached and became involved in patient's care/decision making.   Interval History:  - 03/23/22 -Required as needed lorazepam -Very poor oral intake - guardianship case in court on January 4th,  2024  Assessment and Plan:  1)Acute CVA -Appreciate Neurology Consult - dispo TBD; SNF Vs residential hospice  -MRI brain--acute right MCA infarct with cytotoxic edema -MRA brain--right MCA M2 occlusion -Carotid Duplex--no hemodynamically significant stenosis -Echo--EF 60-65%, no WMA, grade 1 DD, no PFO - 14 day event monitor previously recommended, but will not change long-term outcome -LDL--85 -HbA1C--5.2 -Antiplatelet--ASA 81 + Plavix 75 mg x 3 weeks, then ASA 81 alone - poor PEG candidate and was dependent on NGT for meds - see GOC for further  -Difficulty with oral medications persist -Difficulty with oral intake/food intake  persist  2)Social/Ethics/Goals of care -He has significant dysphagia and is currently unable to swallow.  Suspect that he also may be aspirating his oral secretions -Initially, no family available to address further goals/decision making and therefore DSS started interim guardianship - After DSS involvement, patient's cousin called to check on him and is now engaged in his care.  His cousin Thomas Huber reports that her and her husband were very involved in patient's care prior to his admission.  They would take him to his doctors appointments. In fact he had stayed with them for approximately a month after his mother's death. They relate that Thomas Huber truly liked his independence which is why he was still living independently, with Thomas Huber and her husband frequently checking on him.  Kim feels certain that Thomas Huber would not want any aggressive measures such as intubation or CPR at the end of his life.  She is strongly in support of DNR. -Dr. Kerry Huber reviewed the case with palliative care Dr. Phillips Huber, who's team has also been following.  Please refer to her note.  Considering patient's current mental status, neurologic deficits, high risk for complications such as recurrent aspiration pneumonia and underlying Parkinson's disease, it is agreed that CPR and intubation at the time of his death would inflict pain and suffering and unlikely provide any meaningful medical benefit.   - Dr. Frederick Huber agreed with Dr. Lamar Huber opinion that DNR would be appropriate in this situation.  His family also agrees with this recommendation and feel as though the patient would want the same -Furthermore, at this point in time, patient is showing no signs of improvement or meaningful recovery.  Repeat SLP  eval 6 days after admission on 03/17/2022 also showed significant risk for aspiration and patient unsafe for oral intake if remaining aggressive in medical treatment.  Patient has significant deficits from his stroke with progressive functional  decline and little chance if any of meaningful recovery.  He is expected to continue to have imminent decline and without adequate nutrition would approach end of life regardless ~ 2 weeks; and as noted above, he is not appropriate for PEG placement as this is deemed futile medical treatment and still will not result in meaningful recovery - therefore, after further discussion with palliative care and with discussion with Thomas Huber, the patient was transitioned to comfort care; he will remain in hospital for likely an inpatient death, however if he remains stable enough for transfer to residential hospice, we possibly may pursue that as well -c/n scopolamine patch due to excessive secretions -c/n  as needed Robinul - guardianship case in court on January 4th,  2024   3)Acute metabolic encephalopathy -Secondary to ischemic stroke with cytotoxic edema and B12 deficiency -UA negative for pyuria -Folic acid 8.5, B12 low -Ammonia 24 -TSH 1.3 - continue comfort care - continue foley   Dyslipidemia -discontinue statin in lieu of discussion above -LDL 85   Parkinson's disease -focusing on comfort    Alcohol dependence in remission - continue comfort care   Dysphagia - Suspect he is aspirating his oral secretions -  NG Tube dced 12/24 and transitioning to comfort    Antimicrobials: Unasyn 12/21 >> 12/24   DVT prophylaxis:  --Comfort care   Code Status:   Code Status: DNR  Mobility Assessment (last 72 hours)     Mobility Assessment     Row Name 03/21/22 2058           Does patient have an order for bedrest or is patient medically unstable Yes- Bedfast (Level 1) - Complete       What is the highest level of mobility based on the progressive mobility assessment? Level 1 (Bedfast) - Unable to balance while sitting on edge of bed       Is the above level different from baseline mobility prior to current illness? No - Consider discontinuing PT/OT               Barriers to  discharge: Decision making issues--- guardianship case in court on January 4th,  2024 Disposition Plan:  inpatient comfort care vs residential hospice Status is: Inpt  Objective: Blood pressure 122/65, pulse 84, temperature 98.8 F (37.1 C), temperature source Axillary, resp. rate (!) 32, height 5\' 7"  (1.702 m), weight 86.2 kg, SpO2 (!) 85 %.   Physical Exam  Gen:-Resting comfortably, lethargic  HEENT:- [place].AT, No sclera icterus Neck-Supple Neck,No JVD,.  Lungs-fair air movement, no wheezing CV- S1, S2 normal, RRR Abd-  +ve B.Sounds, Abd Soft, No tenderness,    Extremity/Skin:- No  edema,   good pedal pulses  Neuro-Psych-less lethargic with occasional episodes of restlessness, stroke related deficits  GU-Foley in situ  Consultants:  Palliative care Ethics Neurology   LOS: 12 days   , MD [facility] 03/23/2022, 5:12 PM

## 2022-03-24 DIAGNOSIS — I63511 Cerebral infarction due to unspecified occlusion or stenosis of right middle cerebral artery: Secondary | ICD-10-CM | POA: Diagnosis not present

## 2022-03-24 NOTE — Progress Notes (Signed)
Patient unresponsive to voice or touch. Respirations 56 for full minute. Morphine given for tachypnea. Patient lying resting in bed with eyes closed does not appear to be in any distress. Warm to touch. Temp 103. Rectal tylenol given. Will continue to monitor. Bed in lowest position bed linens changed by tech and bath given.

## 2022-03-24 NOTE — Progress Notes (Signed)
Progress Note    Thomas Huber   IRS:[id number]  DOB: 25-Nov-1955  DOA: 04-07-2022     13 PCP: Thomas Specking, MD  Initial CC: found down  Hospital Course: Thomas Huber is a 66 year old male with a history of Parkinson's disease who presented when he was found unresponsive in his apartment.  The last known well time was unclear.  History was unobtainable because of the patient's encephalopathy. Apparently, EMS noted the patient to have facial droop, slurred speech, left arm weakness.  On 03/11/2022 after arrival, the patient eventually became agitated and required sedation with IV Ativan.  An NG tube was subsequently placed because of his dysphagia.  Neurology was consulted.  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 Notably, the patient did have a long history of alcohol dependence previously, but quit slightly over 1 year prior to this admission. Due to unavailable family initially there was a Thomas Huber request for interm guardianship. After this, the patient's cousin was able to be reached and became involved in patient's care/decision making.   Interval History:  - 03/24/22 -Required as needed lorazepam -Very poor oral intake -Occasional episodes of tachypnea usually relieved by morphine sulfate - guardianship case in court on January 4th,  2024  Assessment and Plan:  1)Acute CVA -Appreciate Neurology Consult - dispo TBD; SNF Vs residential hospice  -MRI brain--acute right MCA infarct with cytotoxic edema -MRA brain--right MCA M2 occlusion -Carotid Duplex--no hemodynamically significant stenosis -Echo--EF 60-65%, no WMA, grade 1 DD, no PFO - 14 day event monitor previously recommended, but will not change long-term outcome -LDL--85 -HbA1C--5.2 -Antiplatelet--ASA 81 + Plavix 75 mg x 3 weeks, then ASA 81 alone - poor PEG candidate and was dependent on NGT for meds - see GOC for further  -Difficulty with oral  medications persist -Difficulty with oral intake/food intake persist -Overall prognosis remains poor/Grave --Continue comfort care measures  2)Social/Ethics/Goals of care -He has significant dysphagia and is currently unable to swallow.  Suspect that he also may be aspirating his oral secretions -Initially, no family available to address further goals/decision making and therefore Thomas Huber started interim guardianship - After Thomas Huber involvement, patient's cousin called to check on him and is now engaged in his care.  His cousin Thomas Huber reports that her and her husband were very involved in patient's care prior to his admission.  They would take him to his doctors appointments. In fact he had stayed with them for approximately a month after his mother's death. They relate that Thomas Huber truly liked his independence which is why he was still living independently, with Thomas Huber and her husband frequently checking on him.  Thomas Huber feels certain that Thomas Huber would not want any aggressive measures such as intubation or CPR at the end of his life.  She is strongly in support of DNR. -Dr. Kerry Huber reviewed the case with palliative care Dr. Phillips Huber, who's team has also been following.  Please refer to her note.  Considering patient's current mental status, neurologic deficits, high risk for complications such as recurrent aspiration pneumonia and underlying Parkinson's disease, it is agreed that CPR and intubation at the time of his death would inflict pain and suffering and unlikely provide any meaningful medical benefit.   - Dr. Frederick Huber agreed with Dr. Lamar Huber opinion that DNR would be appropriate in this situation.  His family also agrees with this recommendation and feel as though the patient would want the same -Furthermore, at  this point in time, patient is showing no signs of improvement or meaningful recovery.  Repeat SLP eval 6 days after admission on 03/17/2022 also showed significant risk for aspiration and patient unsafe for  oral intake if remaining aggressive in medical treatment.  Patient has significant deficits from his stroke with progressive functional decline and little chance if any of meaningful recovery.  He is expected to continue to have imminent decline and without adequate nutrition would approach end of life regardless ~ 2 weeks; and as noted above, he is not appropriate for PEG placement as this is deemed futile medical treatment and still will not result in meaningful recovery - therefore, after further discussion with palliative care and with discussion with Thomas Huber, the patient was transitioned to comfort care; he will remain in hospital for likely an inpatient death, however if he remains stable enough for transfer to residential hospice, we possibly may pursue that as well -c/n scopolamine patch due to excessive secretions -c/n  as needed Robinul - guardianship case in court on January 4th,  2024 -Continue comfort care measures   3)Acute metabolic encephalopathy -Secondary to ischemic stroke with cytotoxic edema and B12 deficiency -UA negative for pyuria -Folic acid 8.5, 123456 low -Ammonia 24 -TSH 1.3 --Continue comfort care measures - continue foley   Dyslipidemia -discontinue statin in lieu of discussion above -LDL 85   Parkinson's disease -focusing on comfort    Alcohol dependence in remission - continue comfort care   Dysphagia - Suspect he is aspirating his oral secretions -  NG Tube dced 12/24 and transitioning to comfort    Antimicrobials: Unasyn 12/21 >> 12/24   DVT prophylaxis:  --Comfort care   Code Status:   Code Status: DNR  Mobility Assessment (last 72 hours)     Mobility Assessment     Row Name 03/21/22 2058           Does patient have an order for bedrest or is patient medically unstable Yes- Bedfast (Level 1) - Complete       What is the highest level of mobility based on the progressive mobility assessment? Level 1 (Bedfast) - Unable to balance while  sitting on edge of bed       Is the above level different from baseline mobility prior to current illness? No - Consider discontinuing PT/OT               Barriers to discharge: Decision making issues--- guardianship case in court on January 4th,  2024 Disposition Plan:  inpatient comfort care vs residential hospice Status is: Inpt  Objective: Blood pressure (!) 74/51, pulse (!) 101, temperature (!) 100.8 F (38.2 C), temperature source Axillary, resp. rate (!) 36, height 5\' 7"  (1.702 m), weight 86.2 kg, SpO2 (!) 85 %.   Physical Exam  Gen:-Resting comfortably, lethargic  HEENT:- [place].AT, No sclera icterus Neck-Supple Neck,No JVD,.  Lungs-fair air movement, no wheezing CV- S1, S2 normal, RRR Abd-  +ve B.Sounds, Abd Soft, No tenderness,    Extremity/Skin:- No  edema,   good pedal pulses  Neuro-Psych-lethargic , stroke related deficits  GU-Foley in situ  Consultants:  Palliative care Ethics Neurology   LOS: 13 days   Roxan Hockey, MD [facility] 03/24/2022, 2:34 PM

## 2022-03-25 DIAGNOSIS — I63511 Cerebral infarction due to unspecified occlusion or stenosis of right middle cerebral artery: Secondary | ICD-10-CM | POA: Diagnosis not present

## 2022-03-25 NOTE — Progress Notes (Signed)
Progress Note    Roberth Berling   UUV:[id number]  DOB: December 01, 1955  DOA: 03/02/2022     14 PCP: Glenda Chroman, MD  Initial CC: found down  Hospital Course: Mr. Ciampi is a 67 year old male with a history of Parkinson's disease who presented when he was found unresponsive in his apartment.  The last known well time was unclear.  History was unobtainable because of the patient's encephalopathy. Apparently, EMS noted the patient to have facial droop, slurred speech, left arm weakness.  On 03/11/2022 after arrival, the patient eventually became agitated and required sedation with IV Ativan.  An NG tube was subsequently placed because of his dysphagia.  Neurology was consulted.  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 Notably, the patient did have a long history of alcohol dependence previously, but quit slightly over 1 year prior to this admission. Due to unavailable family initially there was a DSS request for interm guardianship. After this, the patient's cousin was able to be reached and became involved in patient's care/decision making.   Interval History:  - 03/25/22 -Very poor oral intake -Occasional episodes of tachypnea usually relieved by morphine sulfate - guardianship case in court on January 4th,  2024 - Patient continues to decline overall anticipate in-hospital death  Assessment and Plan:  1)Acute CVA -Appreciate Neurology Consult - dispo TBD; SNF Vs residential hospice  -MRI brain--acute right MCA infarct with cytotoxic edema -MRA brain--right MCA M2 occlusion -Carotid Duplex--no hemodynamically significant stenosis -Echo--EF 60-65%, no WMA, grade 1 DD, no PFO - 14 day event monitor previously recommended, but will not change long-term outcome -LDL--85 -HbA1C--5.2 -Antiplatelet--ASA 81 + Plavix 75 mg x 3 weeks, then ASA 81 alone - poor PEG candidate and was dependent on NGT for meds - see GOC for  further  -Difficulty with oral medications persist -Difficulty with oral intake/food intake persist -Overall prognosis remains poor/Grave --Continue comfort care measures  2)Social/Ethics/Goals of care -He has significant dysphagia and is currently unable to swallow.  Suspect that he also may be aspirating his oral secretions -Initially, no family available to address further goals/decision making and therefore DSS started interim guardianship - After DSS involvement, patient's cousin called to check on him and is now engaged in his care.  His cousin Maudie Mercury reports that her and her husband were very involved in patient's care prior to his admission.  They would take him to his doctors appointments. In fact he had stayed with them for approximately a month after his mother's death. They relate that Keller truly liked his independence which is why he was still living independently, with Maudie Mercury and her husband frequently checking on him.  Kim feels certain that Quill would not want any aggressive measures such as intubation or CPR at the end of his life.  She is strongly in support of DNR. -Dr. Roderic Palau reviewed the case with palliative care Dr. Hilma Favors, who's team has also been following.  Please refer to her note.  Considering patient's current mental status, neurologic deficits, high risk for complications such as recurrent aspiration pneumonia and underlying Parkinson's disease, it is agreed that CPR and intubation at the time of his death would inflict pain and suffering and unlikely provide any meaningful medical benefit.   - Dr. Sabino Gasser agreed with Dr. Delanna Ahmadi opinion that DNR would be appropriate in this situation.  His family also agrees with this recommendation and feel as though the patient would  want the same -Furthermore, at this point in time, patient is showing no signs of improvement or meaningful recovery.  Repeat SLP eval 6 days after admission on 03/17/2022 also showed significant risk for  aspiration and patient unsafe for oral intake if remaining aggressive in medical treatment.  Patient has significant deficits from his stroke with progressive functional decline and little chance if any of meaningful recovery.  He is expected to continue to have imminent decline and without adequate nutrition would approach end of life regardless ~ 2 weeks; and as noted above, he is not appropriate for PEG placement as this is deemed futile medical treatment and still will not result in meaningful recovery - therefore, after further discussion with palliative care and with discussion with Maudie Mercury, the patient was transitioned to comfort care; he will remain in hospital for likely an inpatient death, however if he remains stable enough for transfer to residential hospice, we possibly may pursue that as well -c/n scopolamine patch due to excessive secretions -c/n  as needed Robinul 03/25/22 -Very poor oral intake -Occasional episodes of tachypnea usually relieved by morphine sulfate - guardianship case in court on January 4th,  2024 -Patient continues to decline overall anticipate in-hospital death -Continue comfort care measures   3)Acute metabolic encephalopathy -Secondary to ischemic stroke with cytotoxic edema and B12 deficiency -UA negative for pyuria -Folic acid 8.5, Q67 low -Ammonia 24 -TSH 1.3 --Continue comfort care measures - continue foley   Dyslipidemia -discontinue statin in lieu of discussion above -LDL 85   Parkinson's disease -focusing on comfort    Alcohol dependence in remission - continue comfort care   Dysphagia - Suspect he is aspirating his oral secretions -  NG Tube dced 12/24 and transitioning to comfort    Antimicrobials: Unasyn 12/21 >> 12/24   DVT prophylaxis:  --Comfort care   Code Status:   Code Status: DNR  Mobility Assessment (last 72 hours)     Mobility Assessment     Row Name 03/25/22 0856           Does patient have an order for bedrest  or is patient medically unstable Yes- Bedfast (Level 1) - Complete               Barriers to discharge: Decision making issues--- guardianship case in court on January 4th,  2024 Disposition Plan:  inpatient comfort care vs residential hospice Status is: Inpt  Objective: Blood pressure (!) 83/63, pulse 86, temperature 98.2 F (36.8 C), temperature source Axillary, resp. rate (!) 32, height 5\' 7"  (1.702 m), weight 86.2 kg, SpO2 93 %.   Physical Exam  Gen:-Resting comfortably, lethargic  HEENT:- [place].AT, No sclera icterus Neck-Supple Neck,No JVD,.  Lungs-fair air movement, no wheezing CV- S1, S2 normal, RRR Abd-  +ve B.Sounds, Abd Soft, No tenderness,    Extremity/Skin:- No  edema,   good pedal pulses  Neuro-Psych-lethargic , stroke related deficits  GU-Foley in situ  Consultants:  Palliative care Ethics Neurology   LOS: 14 days   Roxan Hockey, MD [facility] 03/25/2022, 11:28 AM

## 2022-03-25 DEATH — deceased

## 2022-04-25 NOTE — Progress Notes (Signed)
RN at bedside when patient's respirations ceased. Respirations, pulse, and heart sounds absent. 2nd RN verified TOD 0240. MD notified. Emergency contact Mart Piggs notified by phone. Ms. Carolyn Stare expressed understanding and funeral home is [facility] in [place], [place]. Condolences given to patient family. [employer] notified. Patient post mortem care completed and patient taken to morgue.

## 2022-04-25 NOTE — Death Summary Note (Signed)
DEATH SUMMARY   Patient Details  Name: Thomas Huber MRN: [id number] DOB: 1955/06/25 OIZ:TIWP, Thomas Pih, MD Admission/Discharge Information   Admit Date:  2022-03-24  Date of Death: Date of Death: 09-Apr-2022  Time of Death: Time of Death: 0240  Length of Stay: 15   Principle Cause of death: stroke  Hospital Diagnoses: Principal Problem:   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 (HCC) Active Problems:   Parkinson's disease   Goals of care, counseling/discussion   Aspiration pneumonia (HCC)   Dysphagia   B12 deficiency   Pressure injury of skin   Acute metabolic encephalopathy   HLD (hyperlipidemia)   Hospital Course: Thomas Huber is a 67 year old male with a history of Parkinson's disease who presented when he was found unresponsive in his apartment.  The last known well time was unclear.  History was unobtainable because of the patient's encephalopathy. Apparently, EMS noted the patient to have facial droop, slurred speech, left arm weakness.  On 03/11/2022 after arrival, the patient eventually became agitated and required sedation with IV Ativan.  An NG tube was subsequently placed because of his dysphagia.  Neurology was consulted.  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 Notably, the patient did have a long history of alcohol dependence previously, but quit slightly over 1 year prior to this admission. Due to unavailable family initially there was a DSS request for interm guardianship. After this, the patient's cousin was able to be reached and became involved in patient's care/decision making.   Assessment and Plan: 1)Acute CVA -Appreciate Neurology Consult - dispo TBD; SNF Vs residential hospice  -MRI brain--acute right MCA infarct with cytotoxic edema -MRA brain--right MCA M2 occlusion -Carotid Duplex--no hemodynamically significant stenosis -Echo--EF  60-65%, no WMA, grade 1 DD, no PFO - 14 day event monitor previously recommended, but will not change long-term outcome -LDL--85 -HbA1C--5.2 -Antiplatelet--ASA 81 + Plavix 75 mg x 3 weeks, then ASA 81 alone - poor PEG candidate and was dependent on NGT for meds - see GOC for further  -Difficulty with oral medications persisted -Difficulty with oral intake/food intake persisted -Overall prognosis was poor/Grave -- guardianship case in court was scheduled for January 4th,  2024 --Patient was transitioned to comfort care -Patient expired on April 09, 2022 at 2:40 AM   2)Social/Ethics/Goals of care -He has significant dysphagia and is currently unable to swallow.  Suspect that he also may be aspirating his oral secretions -Initially, no family available to address further goals/decision making and therefore DSS started interim guardianship - After DSS involvement, patient's cousin called to check on him and is now engaged in his care.  His cousin Thomas Huber reports that her and her husband were very involved in patient's care prior to his admission.  They would take him to his doctors appointments. In fact he had stayed with them for approximately a month after his mother's death. They relate that Thomas Huber truly liked his independence which is why he was still living independently, with Thomas Huber and her husband frequently checking on him.  Kim feels certain that [name] not want any aggressive measures such as intubation or CPR at the end of his life.  She is strongly in support of DNR. -Dr. Kerry Hough reviewed the case with palliative care Dr. Phillips Odor, who's team has also been following.  Please refer to her note.  Considering patient's current mental status, neurologic deficits, high risk for complications  such as recurrent aspiration pneumonia and underlying Parkinson's disease, it is agreed that CPR and intubation at the time of his death would inflict pain and suffering and unlikely provide any meaningful medical  benefit.   - Dr. Frederick Peers agreed with Dr. Lamar Blinks opinion that DNR would be appropriate in this situation.  His family also agrees with this recommendation and feel as though the patient would want the same -Furthermore, at this point in time, patient is showing no signs of improvement or meaningful recovery.  Repeat SLP eval 6 days after admission on 03/17/2022 also showed significant risk for aspiration and patient unsafe for oral intake if remaining aggressive in medical treatment.  Patient has significant deficits from his stroke with progressive functional decline and little chance if any of meaningful recovery.  He is expected to continue to have imminent decline and without adequate nutrition would approach end of life regardless ~ 2 weeks; and as noted above, he is not appropriate for PEG placement as this is deemed futile medical treatment and still will not result in meaningful recovery - therefore, after further discussion with palliative care and with discussion with Thomas Huber, the patient was transitioned to comfort care; he will remain in hospital for likely an inpatient death, however if he remains stable enough for transfer to residential hospice, we possibly may pursue that as well -Treated with scopolamine patch due to excessive secretions -Treated with as needed Robinul --Overall prognosis was poor/Grave -- guardianship case in court was scheduled for January 4th,  2024 --Patient was transitioned to comfort care -Patient expired on 2022/04/18 at 2:40 AM   3)Acute metabolic encephalopathy -Secondary to ischemic stroke with cytotoxic edema and B12 deficiency -UA negative for pyuria -Folic acid 8.5, B12 low -Ammonia 24 -TSH 1.3 -Transition to comfort care   Dyslipidemia -discontinue statin in lieu of discussion above -LDL 85  -Transitioned to comfort care  Parkinson's disease -focusing on comfort    Alcohol dependence in remission -Transitioned to comfort care   Dysphagia -  Suspect he is aspirating his oral secretions -  NG Tube dced 12/24 and transitioning to comfort    Consultations: Palliative care  The results of significant diagnostics from this hospitalization (including imaging, microbiology, ancillary and laboratory) are listed below for reference.   Significant Diagnostic Studies: DG CHEST PORT 1 VIEW  Result Date: 03/15/2022 CLINICAL DATA:  Fever, weakness EXAM: PORTABLE CHEST 1 VIEW COMPARISON:  03/13/2022 FINDINGS: Mild cardiomegaly. Diffuse bilateral interstitial pulmonary opacity, not significantly changed. Esophagogastric tube remains with tip and side port below the diaphragm. Gas-filled bowel in the included upper abdomen similar to prior examination. IMPRESSION: 1. Diffuse bilateral interstitial pulmonary opacity, not significantly changed, consistent with edema or infection. No new airspace opacity. 2. Esophagogastric tube remains with tip and side port below the diaphragm. Electronically Signed   By: Jearld Lesch M.D.   On: 03/15/2022 19:32   DG Chest Port 1 View  Result Date: 03/13/2022 CLINICAL DATA:  NG tube placement EXAM: PORTABLE CHEST 1 VIEW COMPARISON:  03/24/2022 FINDINGS: There is interval placement of NG tube with its tip in the region of antrum of the stomach. There is poor inspiration. There are no signs of pulmonary edema or focal pulmonary consolidation. Small linear densities are seen in right lower lung field. There is no pleural effusion or pneumothorax. IMPRESSION: Tip of enteric tube is seen in the distal antrum of the stomach. There are small linear densities in right lower lung field which may suggest crowding of bronchovascular structures  due to poor inspiration or subsegmental atelectasis. Electronically Signed   By: Elmer Picker M.D.   On: 03/13/2022 18:53   ECHOCARDIOGRAM COMPLETE  Result Date: 03/11/2022    ECHOCARDIOGRAM REPORT   Patient Name:   NELSON NOONE Date of Exam: 03/11/2022 Medical Rec #:  [id number]      Height:       67.0 in Accession #:    [id number]    Weight:       190.0 lb Date of Birth:  1956-03-15     BSA:          1.979 m Patient Age:    61 years      BP:           146/65 mmHg Patient Gender: M             HR:           70 bpm. Exam Location:  [facility] Procedure: 2D Echo, Cardiac Doppler and Color Doppler Indications:    Stroke I63.9  History:        Patient has no prior history of Echocardiogram examinations.                 Stroke. Parkinson's disease.  Sonographer:    Alvino Chapel RCS Referring Phys: [id number] OLADAPO ADEFESO  Sonographer Comments: Technically difficult study due to patient not able to understand instructions and very difficult imaging windows. Could NOT image IVC or subcostal. IMPRESSIONS  1. Left ventricular ejection fraction, by estimation, is 60 to 65%. The left ventricle has normal function. The left ventricle has no regional wall motion abnormalities. Left ventricular diastolic parameters are consistent with Grade I diastolic dysfunction (impaired relaxation).  2. Right ventricular systolic function poorly visualized but appears to be normal. The right ventricular size is normal.  3. The mitral valve was not well visualized. No evidence of mitral valve regurgitation. No evidence of mitral stenosis.  4. The aortic valve is tricuspid. Aortic valve regurgitation is not visualized. No aortic stenosis is present. Comparison(s): No prior Echocardiogram. FINDINGS  Left Ventricle: Left ventricular ejection fraction, by estimation, is 60 to 65%. The left ventricle has normal function. The left ventricle has no regional wall motion abnormalities. Definity contrast agent was given IV to delineate the left ventricular  endocardial borders. The left ventricular internal cavity size was normal in size. There is no left ventricular hypertrophy. Left ventricular diastolic parameters are consistent with Grade I diastolic dysfunction (impaired relaxation). Right Ventricle: The right ventricular  size is normal. Right vetricular wall thickness was not well visualized. Right ventricular systolic function poorly visualized but appears to be normal. Left Atrium: Left atrial size was normal in size. Right Atrium: Right atrial size was normal in size. Pericardium: There is no evidence of pericardial effusion. Mitral Valve: The mitral valve was not well visualized. No evidence of mitral valve regurgitation. No evidence of mitral valve stenosis. Tricuspid Valve: The tricuspid valve is not well visualized. Tricuspid valve regurgitation is not demonstrated. No evidence of tricuspid stenosis. Aortic Valve: The aortic valve is tricuspid. Aortic valve regurgitation is not visualized. No aortic stenosis is present. Pulmonic Valve: The pulmonic valve was not well visualized. Pulmonic valve regurgitation is not visualized. No evidence of pulmonic stenosis. Aorta: The aortic root is normal in size and structure. Venous: The inferior vena cava was not well visualized. IAS/Shunts: The interatrial septum was not assessed.  LEFT VENTRICLE PLAX 2D LVIDd:         4.10 cm  Diastology LVIDs:         2.40 cm   LV e' medial:    5.44 cm/s LV PW:         1.00 cm   LV E/e' medial:  10.2 LV IVS:        1.00 cm   LV e' lateral:   8.38 cm/s LVOT diam:     1.90 cm   LV E/e' lateral: 6.6 LV SV:         58 LV SV Index:   30 LVOT Area:     2.84 cm  RIGHT VENTRICLE TAPSE (M-mode): 2.1 cm LEFT ATRIUM           Index LA diam:      4.10 cm 2.07 cm/m LA Vol (A2C): 39.2 ml 19.81 ml/m LA Vol (A4C): 48.4 ml 24.46 ml/m  AORTIC VALVE LVOT Vmax:   104.00 cm/s LVOT Vmean:  68.200 cm/s LVOT VTI:    0.206 m  AORTA Ao Root diam: 3.00 cm MITRAL VALVE MV Area (PHT): 2.50 cm    SHUNTS MV Decel Time: 303 msec    Systemic VTI:  0.21 m MV E velocity: 55.70 cm/s  Systemic Diam: 1.90 cm MV A velocity: 87.80 cm/s MV E/A ratio:  0.63 Vishnu Priya Mallipeddi Electronically signed by Winfield Rast Mallipeddi Signature Date/Time: 03/11/2022/3:26:57 PM    Final     DG Abdomen 1 View  Result Date: 03/11/2022 CLINICAL DATA:  NG tube placement EXAM: ABDOMEN - 1 VIEW COMPARISON:  None Available. FINDINGS: Tip of enteric tube is seen in the region of distal antrum of the stomach. There is presence of gas in few nondilated small bowel loops in upper abdomen. Lower abdomen and pelvis are not included in the image. IMPRESSION: Tip of enteric tube is seen in the distal antrum of the stomach. Electronically Signed   By: Ernie Avena M.D.   On: 03/11/2022 13:15   US Carotid Bilateral  Result Date: 03/11/2022 CLINICAL DATA:  Acute right MCA territory cerebral infarction. EXAM: BILATERAL CAROTID DUPLEX ULTRASOUND TECHNIQUE: Wallace Cullens scale imaging, color Doppler and duplex ultrasound were performed of bilateral carotid and vertebral arteries in the neck. COMPARISON:  None Available. FINDINGS: Criteria: Quantification of carotid stenosis is based on velocity parameters that correlate the residual internal carotid diameter with NASCET-based stenosis levels, using the diameter of the distal internal carotid lumen as the denominator for stenosis measurement. The following velocity measurements were obtained: RIGHT ICA:  58/16 cm/sec CCA:  83/11 cm/sec SYSTOLIC ICA/CCA RATIO:  0.7 ECA:  112 cm/sec LEFT ICA:  62/17 cm/sec CCA:  97/10 cm/sec SYSTOLIC ICA/CCA RATIO:  0.6 ECA:  93 cm/sec RIGHT CAROTID ARTERY: Mild amount of partially calcified plaque at the level of the carotid bulb and proximal right ICA. Estimated right ICA stenosis is less than 50%. RIGHT VERTEBRAL ARTERY: Antegrade flow with normal waveform and velocity. LEFT CAROTID ARTERY: Minimal partially calcified plaque at the level of the left carotid bulb and proximal ICA. Estimated left ICA stenosis is less than 50% LEFT VERTEBRAL ARTERY: Antegrade flow with normal waveform and velocity. IMPRESSION: Mild amount of plaque at the level of both carotid bulbs and proximal internal carotid arteries, right greater than left. No  significant carotid stenosis identified. Estimated bilateral ICA stenoses are less than 50%. Electronically Signed   By: Irish Lack M.D.   On: 03/11/2022 11:27   MR BRAIN WO CONTRAST  Addendum Date: 03/11/2022   ADDENDUM REPORT: 03/11/2022 10:53 ADDENDUM: Study discussed with Dr. Remi Deter  Pearline Cables in the ED by telephone on 03/11/2022 at 10:44 . Electronically Signed   By: Genevie Ann M.D.   On: 03/11/2022 10:53   Result Date: 03/11/2022 CLINICAL DATA:  67 year old male altered mental status. Neurologic deficit. EXAM: MRI HEAD WITHOUT CONTRAST TECHNIQUE: Multiplanar, multiecho pulse sequences of the brain and surrounding structures were obtained without intravenous contrast. COMPARISON:  Head CT yesterday. FINDINGS: Brain: Confluent restricted diffusion in the right insula and frontal operculum in an area of about 3.5 cm (series 5, image 20), tracking cephalad to toward the superior frontal gyrus. T2 and FLAIR hyperintense Cytotoxic edema. No hemorrhagic transformation. No mass effect. No contralateral left hemisphere or posterior fossa restricted diffusion. No midline shift, mass effect, evidence of mass lesion, ventriculomegaly, extra-axial collection or acute intracranial hemorrhage. Cervicomedullary junction and pituitary are within normal limits. Outside of the 8 acutely affected parenchyma gray and white matter signal is largely normal for age. Tiny chronic infarct in the right cerebellum series 15, image 1. Probable tiny chronic lacunar infarct right caudate nucleus series 18, image 29. Vascular: Major intracranial vascular flow voids are preserved. See MRA today reported separately. Skull and upper cervical spine: Negative. Visualized bone marrow signal is within normal limits. Orbits/sinuses: Negative orbits. Paranasal sinuses and mastoids are stable and well aerated. Other: Negative visible scalp and face. IMPRESSION: 1. Positive for acute Right MCA territory infarct, middle sylvian division. Cytotoxic  edema with no hemorrhage or mass effect. 2. Minimal underlying chronic small vessel disease. 3. MRA reported separately. Electronically Signed: By: Genevie Ann M.D. On: 03/11/2022 10:38   MR ANGIO HEAD WO CONTRAST  Result Date: 03/11/2022 CLINICAL DATA:  67 year old male altered mental status. Neurologic deficit. Right MCA infarct. EXAM: MRA HEAD WITHOUT CONTRAST TECHNIQUE: Angiographic images of the Circle of Willis were acquired using MRA technique without intravenous contrast. COMPARISON:  Head CT yesterday. Brain MRI today reported separately. FINDINGS: Anterior circulation: Intermittent mild motion artifact. Antegrade flow in both ICA siphons to the carotid termini. Patent MCA and ACA origins. Mild to moderate bilateral supraclinoid ICA stenosis suspected. Anterior communicating artery and visible ACA branches are within normal limits, the right ACA A2 might be dominant. Left MCA M1 segment and bifurcation are patent. Right MCA M1 segment is patent, but there is occlusion of the M2 middle sylvian branch at its origin at the bifurcation on series 10, image 106. The posterior M2 branch remains patent. Posterior circulation: Antegrade flow in the posterior circulation, the distal right vertebral artery appears dominant and the left appears to terminates in PICA. No visible distal vertebral stenosis. Patent basilar artery without stenosis. Patent basilar tip, SCA and PCA origins. Posterior communicating arteries are diminutive or absent. Motion artifact of the affects bilateral PCA branch detail, proximal PCAs are patent. Anatomic variants: Dominant right vertebral artery appears to supply the basilar. Right ACA A2 may be dominant. Other: MRI head at the same time is reported separately. IMPRESSION: 1. Positive for Right MCA M2 Occlusion: middle and/or anterior division branch origin is occluded at the MCA bifurcation. 2. Motion degraded intracranial MRA is otherwise positive for evidence of ICA siphon  atherosclerosis with mild to moderate supraclinoid stenosis bilaterally. Study discussed with Dr. Wynona Dove in the ED by telephone on 03/11/2022 at 10:44 . Electronically Signed   By: Genevie Ann M.D.   On: 03/11/2022 10:53   DG Chest Port 1 View  Result Date: 03/11/2022 CLINICAL DATA:  Weakness EXAM: PORTABLE CHEST 1 VIEW COMPARISON:  None Available. FINDINGS: Lungs are clear.  No pleural effusion or pneumothorax. The heart is normal in size. IMPRESSION: No evidence of acute cardiopulmonary disease. Electronically Signed   By: Charline BillsSriyesh  Krishnan M.D.   On: 03/11/2022 00:11   CT CERVICAL SPINE WO CONTRAST  Result Date: 03/11/2022 CLINICAL DATA:  Unresponsive EXAM: CT CERVICAL SPINE WITHOUT CONTRAST TECHNIQUE: Multidetector CT imaging of the cervical spine was performed without intravenous contrast. Multiplanar CT image reconstructions were also generated. RADIATION DOSE REDUCTION: This exam was performed according to the departmental dose-optimization program which includes automated exposure control, adjustment of the mA and/or kV according to patient size and/or use of iterative reconstruction technique. COMPARISON:  None Available. FINDINGS: Alignment: Normal cervical lordosis. Skull base and vertebrae: No acute fracture. No primary bone lesion or focal pathologic process. Soft tissues and spinal canal: No prevertebral fluid or swelling. No visible canal hematoma. Disc levels: Mild degenerative changes of the mid/lower cervical spine. Spinal canal is patent. Upper chest: Visualized lung apices are clear. Other: Visualized thyroid is unremarkable. IMPRESSION: No traumatic injury of the cervical spine. Mild degenerative changes. Electronically Signed   By: Charline BillsSriyesh  Krishnan M.D.   On: 03/11/2022 00:08   CT HEAD WO CONTRAST  Result Date: 03/11/2022 CLINICAL DATA:  Delirium. EXAM: CT HEAD WITHOUT CONTRAST TECHNIQUE: Contiguous axial images were obtained from the base of the skull through the vertex  without intravenous contrast. RADIATION DOSE REDUCTION: This exam was performed according to the departmental dose-optimization program which includes automated exposure control, adjustment of the mA and/or kV according to patient size and/or use of iterative reconstruction technique. COMPARISON:  Head CT 03/18/2019 FINDINGS: Brain: No evidence of acute infarction, hemorrhage, hydrocephalus, extra-axial collection or mass lesion/mass effect. Vascular: No hyperdense vessel or unexpected calcification. Skull: Normal. Negative for fracture or focal lesion. Sinuses/Orbits: Patient is status post sinonasal surgery on the left. Mastoid air cells are clear. Chronic nasal bone fractures are present. Orbits are within normal limits. Other: None. IMPRESSION: No acute intracranial abnormality. Electronically Signed   By: Darliss CheneyAmy  Guttmann M.D.   On: 03/11/2022 00:07    Microbiology: No results found for this or any previous visit (from the past 240 hour(s)).  Time spent: 10 minutes  Signed: Shon Haleourage Immaculate Crutcher, MD 04/19/2022

## 2022-04-25 NOTE — Progress Notes (Deleted)
Pt transferred to morgue

## 2022-04-25 DEATH — deceased

## 2022-07-30 ENCOUNTER — Ambulatory Visit: Payer: Medicare Other | Admitting: Neurology
